# Patient Record
Sex: Female | Born: 2000 | Race: Black or African American | Hispanic: No | Marital: Single | Smoking: Never smoker
Health system: Southern US, Community
[De-identification: ages and names within clinical notes are randomized; demographics above are authoritative.]

## PROBLEM LIST (undated history)

## (undated) ENCOUNTER — Inpatient Hospital Stay (HOSPITAL_COMMUNITY): Payer: Self-pay

## (undated) DIAGNOSIS — I10 Essential (primary) hypertension: Secondary | ICD-10-CM

## (undated) DIAGNOSIS — F419 Anxiety disorder, unspecified: Secondary | ICD-10-CM

## (undated) DIAGNOSIS — B009 Herpesviral infection, unspecified: Secondary | ICD-10-CM

## (undated) DIAGNOSIS — Z91018 Allergy to other foods: Secondary | ICD-10-CM

## (undated) DIAGNOSIS — R112 Nausea with vomiting, unspecified: Secondary | ICD-10-CM

## (undated) DIAGNOSIS — O039 Complete or unspecified spontaneous abortion without complication: Secondary | ICD-10-CM

## (undated) DIAGNOSIS — J45909 Unspecified asthma, uncomplicated: Secondary | ICD-10-CM

## (undated) DIAGNOSIS — Z9889 Other specified postprocedural states: Secondary | ICD-10-CM

## (undated) DIAGNOSIS — F32A Depression, unspecified: Secondary | ICD-10-CM

## (undated) DIAGNOSIS — O139 Gestational [pregnancy-induced] hypertension without significant proteinuria, unspecified trimester: Secondary | ICD-10-CM

## (undated) HISTORY — DX: Allergy to other foods: Z91.018

## (undated) HISTORY — DX: Other specified postprocedural states: Z98.890

## (undated) HISTORY — DX: Other specified postprocedural states: R11.2

## (undated) HISTORY — DX: Essential (primary) hypertension: I10

## (undated) HISTORY — PX: NO PAST SURGERIES: SHX2092

## (undated) HISTORY — DX: Nausea with vomiting, unspecified: R11.2

---
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
## 2019-02-07 ENCOUNTER — Encounter (HOSPITAL_COMMUNITY): Payer: Self-pay

## 2019-02-07 ENCOUNTER — Inpatient Hospital Stay (HOSPITAL_COMMUNITY)
Admission: AD | Admit: 2019-02-07 | Discharge: 2019-02-07 | Disposition: A | Discharge status: Home / Self Care | Payer: Medicaid Other | Attending: Obstetrics and Gynecology | Admitting: Obstetrics and Gynecology

## 2019-02-07 ENCOUNTER — Ambulatory Visit (HOSPITAL_COMMUNITY)
Admission: EM | Admit: 2019-02-07 | Discharge: 2019-02-07 | Disposition: A | Discharge status: Home / Self Care | Payer: Medicaid Other | Attending: Family Medicine | Admitting: Family Medicine

## 2019-02-07 ENCOUNTER — Other Ambulatory Visit: Payer: Self-pay

## 2019-02-07 DIAGNOSIS — Z3202 Encounter for pregnancy test, result negative: Secondary | ICD-10-CM | POA: Diagnosis present

## 2019-02-07 DIAGNOSIS — R609 Edema, unspecified: Secondary | ICD-10-CM

## 2019-02-07 DIAGNOSIS — R109 Unspecified abdominal pain: Secondary | ICD-10-CM | POA: Diagnosis not present

## 2019-02-07 DIAGNOSIS — N912 Amenorrhea, unspecified: Secondary | ICD-10-CM | POA: Diagnosis not present

## 2019-02-07 DIAGNOSIS — Z5321 Procedure and treatment not carried out due to patient leaving prior to being seen by health care provider: Secondary | ICD-10-CM | POA: Insufficient documentation

## 2019-02-07 HISTORY — DX: Complete or unspecified spontaneous abortion without complication: O03.9

## 2019-02-07 LAB — POCT PREGNANCY, URINE
Preg Test, Ur: NEGATIVE
Preg Test, Ur: NEGATIVE

## 2019-02-07 NOTE — MAU Note (Signed)
Pt not in lobby.  

## 2019-02-07 NOTE — MAU Note (Signed)
PT left AMA 

## 2019-02-07 NOTE — Discharge Instructions (Addendum)
Your pregnancy test was negative.  Not having a menstrual cycle could be caused by many different things that we discussed I would follow up with your OB/GYN We are checking you for STDs today, bacteria and yeast infection.   Make sure that you are elevating your feet and getting up and moving around.  This could be the cause of your swelling.  You can take the HCTZ that you have.  Try using some compression stockings this may help.  Follow up as needed for continued or worsening symptoms

## 2019-02-07 NOTE — MAU Note (Signed)
PT brought 3 home pregnancy tests w/her. Says they were positive. LMP August 21. Has only mild cramping. Most concerned about bilateral swelling in feet and ankles.

## 2019-02-07 NOTE — ED Triage Notes (Signed)
Pt presents to UC stating she was supposed to start her period between 9/21 to 10/1. Pt reports feeling nauseas, ankle swelling, and headaches. Pt reports having a miscarriage in July 2020.

## 2019-02-07 NOTE — ED Provider Notes (Signed)
[facility]    CSN: [id number] Arrival date & time: 02/07/19  1309      History   Chief Complaint No chief complaint on file.   HPI Tracy Crawford is a 18 y.o. female.   Patient is a 18 year old female past medical history of anxiety, asthma, HSV, miscarriage, SI, major depressive disorder.  She presents today for possible pregnancy.  Reporting positive pregnancy test at home.  Patient reporting miscarriage back in July.  She has not had a menstrual cycle since August 25.  She has had some mild abdominal cramping that has been intermittent along with nausea, breast tenderness.  She is also had some bilateral feet and ankle swelling.  Was previously on hydrochlorothiazide for blood pressure control.  Has not been taking this medication.  Denies any calf pain, tenderness, chest pain or shortness of breath.  Admitting to a sedentary lifestyle.   ROS per HPI      Past Medical History:  Diagnosis Date  . Anxiety   . Asthma    non compliant with medication   . Food allergy    SHELLFISH, TREE NUTS  . HSV (herpes simplex virus) infection   . Miscarriage     Patient Active Problem List   Diagnosis Date Noted  . Vaginal delivery 02/26/2018  . Suicide attempt (HCC) 01/19/2018  . Herpes genitalis in women 01/19/2018  . MDD (major depressive disorder), severe (HCC) 05/11/2017    Past Surgical History:  Procedure Laterality Date  . NO PAST SURGERIES      OB History    Gravida  1   Para      Term      Preterm      AB      Living        SAB      TAB      Ectopic      Multiple      Live Births               Home Medications    Prior to Admission medications   Medication Sig Start Date End Date Taking? Authorizing Provider  albuterol (VENTOLIN HFA) 108 (90 Base) MCG/ACT inhaler Inhale 2 puffs into the lungs every 4 (four) hours as needed. 09/11/18   Bobbitt, Heywood Iles, MD  budesonide-formoterol [facility]) 160-4.5 MCG/ACT inhaler Inhale 2  puffs into the lungs 2 (two) times daily. 01/24/18   Bobbitt, Heywood Iles, MD  hydrochlorothiazide (HYDRODIURIL) 25 MG tablet Take 1 tablet (25 mg total) by mouth daily. 02/28/18   Nigel Bridgeman, CNM  montelukast (SINGULAIR) 10 MG tablet Take 0.5 tablets (5 mg total) by mouth at bedtime. 01/24/18   Bobbitt, Heywood Iles, MD  ferrous sulfate 325 (65 FE) MG tablet Take 1 tablet (325 mg total) by mouth daily with breakfast. 02/28/18 02/07/19  Nigel Bridgeman, CNM    Family History Family History  Problem Relation Age of Onset  . Asthma Father   . Asthma Maternal Aunt   . Asthma Paternal Grandmother     Social History Social History   Tobacco Use  . Smoking status: Never Smoker  . Smokeless tobacco: Never Used  Substance Use Topics  . Alcohol use: No  . Drug use: Yes    Frequency: 1.0 times per week    Types: Marijuana    Comment: Last marijuana use - August 2019     Allergies   Shellfish allergy, Apple, Augmentin [amoxicillin-pot clavulanate], Banana, Ceftin [cefuroxime axetil], Cherry, Peach [prunus persica], Plum  pulp, and Watermelon [citrullus vulgaris]   Review of Systems Review of Systems   Physical Exam Triage Vital Signs ED Triage Vitals  Enc Vitals Group     BP 02/07/19 1417 136/78     Pulse Rate 02/07/19 1417 91     Resp 02/07/19 1417 16     Temp 02/07/19 1417 98.5 F (36.9 C)     Temp Source 02/07/19 1417 Oral     SpO2 02/07/19 1417 100 %     Weight --      Height --      Head Circumference --      Peak Flow --      Pain Score 02/07/19 1421 1     Pain Loc --      Pain Edu? --      Excl. in [place]? --    No data found.  Updated Vital Signs BP 136/78 (BP Location: Right Arm)   Pulse 91   Temp 98.5 F (36.9 C) (Oral)   Resp 16   LMP 01/22/2019   SpO2 100%   Visual Acuity Right Eye Distance:   Left Eye Distance:   Bilateral Distance:    Right Eye Near:   Left Eye Near:    Bilateral Near:     Physical Exam Vitals signs and nursing note reviewed.   Constitutional:      General: She is not in acute distress.    Appearance: Normal appearance. She is not ill-appearing, toxic-appearing or diaphoretic.  HENT:     Head: Normocephalic.     Nose: Nose normal.     Mouth/Throat:     Pharynx: Oropharynx is clear.  Eyes:     Conjunctiva/sclera: Conjunctivae normal.  Neck:     Musculoskeletal: Normal range of motion.  Pulmonary:     Effort: Pulmonary effort is normal.  Abdominal:     Palpations: Abdomen is soft.     Tenderness: There is no abdominal tenderness.  Musculoskeletal: Normal range of motion.     Right lower leg: Edema present.     Left lower leg: Edema present.  Skin:    General: Skin is warm and dry.     Findings: No rash.  Neurological:     Mental Status: She is alert.  Psychiatric:        Mood and Affect: Mood normal.      UC Treatments / Results  Labs (all labs ordered are listed, but only abnormal results are displayed) Labs Reviewed  POC URINE PREG, ED  POCT PREGNANCY, URINE  CERVICOVAGINAL ANCILLARY ONLY    EKG   Radiology No results found.  Procedures Procedures (including critical care time)  Medications Ordered in UC Medications - No data to display  Initial Impression / Assessment and Plan / UC Course  I have reviewed the triage vital signs and the nursing notes.  Pertinent labs & imaging results that were available during my care of the patient were reviewed by me and considered in my medical decision making (see chart for details).     Negative pregnancy test-2 positives at home.  Patient reporting that she took the test and then did not check on the test for up to 30 minutes. Pregnancy test negative here. Most likely would be positive if she has not had a menstrual cycle since August 25. Amenorrhea must be related to something else.  Possible hormonal imbalance, PCOS.  Commended follow-up with OB/GYN for further evaluation and management.  Bilateral foot and ankle swelling.  Likely  from sedentary lifestyle. Blood pressure 136/78 today.  No concern for DVT Patient asked if she can take her hydrochlorothiazide she has at home.  I feel this would be appropriate. Also recommended exercise, elevating the feet more using compression stockings. Decrease salt intake  Final Clinical Impressions(s) / UC Diagnoses   Final diagnoses:  Encounter for pregnancy test, result negative  Amenorrhea     Discharge Instructions     Your pregnancy test was negative.  Not having a menstrual cycle could be caused by many different things that we discussed I would follow up with your OB/GYN We are checking you for STDs today, bacteria and yeast infection.   Make sure that you are elevating your feet and getting up and moving around.  This could be the cause of your swelling.  You can take the HCTZ that you have.  Try using some compression stockings this may help.  Follow up as needed for continued or worsening symptoms     ED Prescriptions    None     PDMP not reviewed this encounter.   Dahlia ByesBast, Haskell Rihn A, NP 02/07/19 1500

## 2019-02-07 NOTE — MAU Note (Signed)
PT was note in lobby when phlebotomy came to get her.

## 2019-02-12 ENCOUNTER — Telehealth (HOSPITAL_COMMUNITY): Payer: Self-pay | Admitting: Emergency Medicine

## 2019-02-12 ENCOUNTER — Other Ambulatory Visit: Payer: Medicaid Other

## 2019-02-12 LAB — CERVICOVAGINAL ANCILLARY ONLY
Bacterial vaginitis: POSITIVE — AB
Candida vaginitis: NEGATIVE
Chlamydia: POSITIVE — AB
Neisseria Gonorrhea: NEGATIVE
Trichomonas: NEGATIVE

## 2019-02-12 MED ORDER — METRONIDAZOLE 500 MG PO TABS: 500.0000 mg | ORAL_TABLET | Freq: Two times a day (BID) | ORAL | 0 refills | Status: AC

## 2019-02-12 MED ORDER — AZITHROMYCIN 250 MG PO TABS: 1000.0000 mg | ORAL_TABLET | Freq: Once | ORAL | 0 refills | Status: AC

## 2019-02-12 NOTE — Telephone Encounter (Signed)
Chlamydia is positive.  Rx po zithromax 1g #1 dose no refills was sent to the pharmacy of record.  Pt needs education to please refrain from sexual intercourse for 7 days to give the medicine time to work, sexual partners need to be notified and tested/treated.  Condoms may reduce risk of reinfection.  Recheck or followup with PCP for further evaluation if symptoms are not improving.   [facility] notified.  Bacterial vaginosis is positive. This was not treated at the urgent care visit.  Flagyl 500 mg BID x 7 days #14 no refills sent to patients pharmacy of choice.    Patient contacted and made aware of    results, all questions answered

## 2019-03-05 ENCOUNTER — Other Ambulatory Visit: Payer: Self-pay

## 2019-03-05 ENCOUNTER — Other Ambulatory Visit: Payer: Self-pay | Admitting: Allergy and Immunology

## 2019-03-05 MED ORDER — MONTELUKAST SODIUM 10 MG PO TABS: 5.0000 mg | ORAL_TABLET | Freq: Every day | ORAL | 0 refills | Status: DC

## 2019-03-05 MED ORDER — BUDESONIDE-FORMOTEROL FUMARATE 160-4.5 MCG/ACT IN AERO: 2.0000 | INHALATION_SPRAY | Freq: Two times a day (BID) | RESPIRATORY_TRACT | 5 refills | Status: DC

## 2019-03-05 NOTE — Telephone Encounter (Signed)
Spoke with patient. Patient informed that one time refills for Symbicort and Montelukast were sent to her pharmacy. Patient notified that no further refills would be authorized until after her 03/16/19 office visit. Patient verbalized understanding.

## 2019-03-05 NOTE — Telephone Encounter (Signed)
Patient is requesting refills on all medications from Dr. Verlin Fester. Last seen September of 2019. I advised she needed an appointment. Made one for 03-16-19. [facility] on [address].

## 2019-03-16 ENCOUNTER — Encounter: Payer: Self-pay | Admitting: Family Medicine

## 2019-03-16 ENCOUNTER — Ambulatory Visit (INDEPENDENT_AMBULATORY_CARE_PROVIDER_SITE_OTHER): Payer: Medicaid Other | Admitting: Family Medicine

## 2019-03-16 ENCOUNTER — Other Ambulatory Visit: Payer: Self-pay

## 2019-03-16 VITALS — BP 132/86 | HR 90 | Temp 98.2°F | Resp 18

## 2019-03-16 DIAGNOSIS — J4540 Moderate persistent asthma, uncomplicated: Secondary | ICD-10-CM

## 2019-03-16 DIAGNOSIS — J3089 Other allergic rhinitis: Secondary | ICD-10-CM

## 2019-03-16 DIAGNOSIS — J302 Other seasonal allergic rhinitis: Secondary | ICD-10-CM

## 2019-03-16 DIAGNOSIS — T7800XD Anaphylactic reaction due to unspecified food, subsequent encounter: Secondary | ICD-10-CM | POA: Diagnosis not present

## 2019-03-16 DIAGNOSIS — H1013 Acute atopic conjunctivitis, bilateral: Secondary | ICD-10-CM

## 2019-03-16 MED ORDER — ALBUTEROL SULFATE HFA 108 (90 BASE) MCG/ACT IN AERS: 2.0000 | INHALATION_SPRAY | RESPIRATORY_TRACT | 5 refills | Status: DC | PRN

## 2019-03-16 MED ORDER — MONTELUKAST SODIUM 10 MG PO TABS: 10.0000 mg | ORAL_TABLET | Freq: Every day | ORAL | 5 refills | Status: DC

## 2019-03-16 MED ORDER — FLUTICASONE PROPIONATE 50 MCG/ACT NA SUSP: 1.0000 | Freq: Every day | NASAL | 5 refills | Status: DC

## 2019-03-16 MED ORDER — PAZEO 0.7 % OP SOLN: 1.0000 [drp] | Freq: Once | OPHTHALMIC | 5 refills | Status: AC

## 2019-03-16 MED ORDER — CETIRIZINE HCL 10 MG PO TABS: 10.0000 mg | ORAL_TABLET | Freq: Every day | ORAL | 5 refills | Status: DC

## 2019-03-16 MED ORDER — EPINEPHRINE 0.3 MG/0.3ML IJ SOAJ: 0.3000 mg | Freq: Once | INTRAMUSCULAR | 2 refills | Status: AC

## 2019-03-16 MED ORDER — BUDESONIDE-FORMOTEROL FUMARATE 160-4.5 MCG/ACT IN AERO: 2.0000 | INHALATION_SPRAY | Freq: Two times a day (BID) | RESPIRATORY_TRACT | 5 refills | Status: DC

## 2019-03-16 NOTE — Progress Notes (Signed)
[address] [place] [address] Dept: [phone]  FOLLOW UP NOTE  Patient ID: Tracy Crawford, female    DOB: August 02, 2000  Age: 18 y.o. MRN: [id number] Date of Office Visit: 03/16/2019  Assessment  Chief Complaint: Wheezing  HPI Tracy Crawford is a 18 year old female who presents to the clinic for a follow up visit. She was last seen in this clinic on 01/24/2018 by Dr. Nunzio Cobbs for evaluation of asthma, allergic rhinitis, allergic conjunctivitis, and food allergy to shellfish, tree nuts, and fruits. At today's visit, she reports her asthma has not been well controlled for about 1-2 weeks with wheezing and shortness of breath with activity. She reports she is taking montelukast 10 mg once a day and using Symbicort 160-2 puffs every 6 hours without a spacer. She has been out of albuterol for a few months. Records indicate that she has not picked up these medications from the pharmacy listed on a regular basis. Allergic rhinitis is reported as not well controlled with nasal congestion and thick post nasal drainage for which she is not currently using any medical intervention. She reports allergic rhinitis has not been well controlled with frequent red, itchy eyes and she is out of antihistamine eye drops at this time. Her current medications are listed in the chart.    Drug Allergies:  Allergies  Allergen Reactions  . Shellfish Allergy Anaphylaxis  . Apple Itching  . Augmentin [Amoxicillin-Pot Clavulanate] Hives    Has patient had a PCN reaction causing immediate rash, facial/tongue/throat swelling, SOB or lightheadedness with hypotension: Unknown Has patient had a PCN reaction causing severe rash involving mucus membranes or skin necrosis: Unknown Has patient had a PCN reaction that required hospitalization: Unknown Has patient had a PCN reaction occurring within the last 10 years: Yes If all of the above answers are "NO", then may proceed with Cephalosporin use.   . Banana Itching   . Ceftin [Cefuroxime Axetil] Hives    Unknown reaction  . Cherry   . Peach [Prunus Persica] Itching  . Plum Pulp Itching  . Watermelon [Citrullus Vulgaris] Itching    Physical Exam: BP 132/86 (BP Location: Right Arm, Patient Position: Sitting, Cuff Size: Normal)   Pulse 90   Temp 98.2 F (36.8 C) (Temporal)   Resp 18   SpO2 97%    Physical Exam Vitals signs reviewed.  Constitutional:      Appearance: Normal appearance.  HENT:     Head: Normocephalic and atraumatic.     Right Ear: Tympanic membrane normal.     Left Ear: Tympanic membrane normal.     Nose:     Comments: Bilateral nares edematous and pale with clear nasal drainage noted. Pharynx slightly erythematous with no exudate noted. Ears normal. Eyes normal. Eyes:     Conjunctiva/sclera: Conjunctivae normal.  Neck:     Musculoskeletal: Normal range of motion and neck supple.  Cardiovascular:     Rate and Rhythm: Normal rate and regular rhythm.     Heart sounds: Normal heart sounds. No murmur.  Pulmonary:     Effort: Pulmonary effort is normal.     Breath sounds: Normal breath sounds.     Comments: Lungs clear to auscultation Musculoskeletal: Normal range of motion.  Skin:    General: Skin is warm and dry.  Neurological:     Mental Status: She is alert and oriented to person, place, and time.  Psychiatric:        Mood and Affect: Mood normal.  Behavior: Behavior normal.        Thought Content: Thought content normal.        Judgment: Judgment normal.     Diagnostics: FVC 3.86, FEV1 3.17. Predicted FVC 3.51, predicted FEV1 3.13. Spirometry indicates normal ventilatory function.   Assessment and Plan: 1. Moderate persistent asthma without complication   2. Seasonal and perennial allergic rhinitis   3. Allergic conjunctivitis of both eyes   4. Anaphylactic shock due to food, subsequent encounter     Meds ordered this encounter  Medications  . montelukast (SINGULAIR) 10 MG tablet    Sig: Take 1  tablet (10 mg total) by mouth at bedtime.    Dispense:  30 tablet    Refill:  5  . budesonide-formoterol (SYMBICORT) 160-4.5 MCG/ACT inhaler    Sig: Inhale 2 puffs into the lungs 2 (two) times daily. Use with spacer.    Dispense:  1 Inhaler    Refill:  5  . albuterol (VENTOLIN HFA) 108 (90 Base) MCG/ACT inhaler    Sig: Inhale 2 puffs into the lungs every 4 (four) hours as needed for wheezing or shortness of breath.    Dispense:  18 g    Refill:  5  . cetirizine (ZYRTEC) 10 MG tablet    Sig: Take 1 tablet (10 mg total) by mouth daily. Use as needed.    Dispense:  30 tablet    Refill:  5  . fluticasone (FLONASE) 50 MCG/ACT nasal spray    Sig: Place 1-2 sprays into both nostrils daily. As needed.    Dispense:  1 g    Refill:  5  . Olopatadine HCl (PAZEO) 0.7 % SOLN    Sig: Place 1 drop into both eyes once for 1 dose. As needed.    Dispense:  2.5 mL    Refill:  5  . EPINEPHrine (EPIPEN 2-PAK) 0.3 mg/0.3 mL IJ SOAJ injection    Sig: Inject 0.3 mLs (0.3 mg total) into the muscle once for 1 dose.    Dispense:  2 each    Refill:  2    Patient Instructions  Asthma Continue montelukast 10 mg once a day to prevent cough or wheeze Continue Symbicort 160-2 puffs twice a day with a spacer to prevent cough or wheeze Continue albuterol 2 puffs every 4 hours as needed for a cough or wheeze  Allergic rhinitis Begin cetirizine 10 mg once a day as needed for a runny nose Begin Flonase 1-2 sprays in each nostril once a day as needed for a stuffy nose Consider saline nasal rinses as needed for nasal symptoms. Use this before any medicated nasal sprays for best result  Allergic conjunctivitis Begin Pazeo one drop in each eye once a day as needed for red, itchy eyes  Food allergy Continue to avoid shellfish, tree nuts, and fruits that bother your mouth In case of an allergic reaction, take Benadryl 50 mg every 4 hours, and if life-threatening symptoms occur, inject with EpiPen 0.3 mg.  Call  the clinic if this treatment plan is not working well for you  Follow up in 2 months or sooner if needed.   Return in about 2 months (around 05/16/2019), or if symptoms worsen or fail to improve.    Thank you for the opportunity to care for this patient.  Please do not hesitate to contact me with questions.  Gareth Morgan, FNP [facility]

## 2019-03-16 NOTE — Patient Instructions (Addendum)
Asthma Continue montelukast 10 mg once a day to prevent cough or wheeze Continue Symbicort 160-2 puffs twice a day with a spacer to prevent cough or wheeze Continue albuterol 2 puffs every 4 hours as needed for a cough or wheeze  Allergic rhinitis Begin cetirizine 10 mg once a day as needed for a runny nose Begin Flonase 1-2 sprays in each nostril once a day as needed for a stuffy nose Consider saline nasal rinses as needed for nasal symptoms. Use this before any medicated nasal sprays for best result  Allergic conjunctivitis Begin Pazeo one drop in each eye once a day as needed for red, itchy eyes  Food allergy Continue to avoid shellfish, tree nuts, and fruits that bother your mouth In case of an allergic reaction, take Benadryl 50 mg every 4 hours, and if life-threatening symptoms occur, inject with EpiPen 0.3 mg.  Call the clinic if this treatment plan is not working well for you  Follow up in 2 months or sooner if needed.

## 2019-04-05 ENCOUNTER — Other Ambulatory Visit: Payer: Self-pay | Admitting: Nurse Practitioner

## 2019-04-09 ENCOUNTER — Other Ambulatory Visit: Payer: Medicaid Other

## 2019-05-17 NOTE — Progress Notes (Deleted)
   [address] [place] [address] Dept: [phone]  FOLLOW UP NOTE  Patient ID: Tracy Crawford, female    DOB: October 05, 2000  Age: 19 y.o. MRN: [id number] Date of Office Visit: 05/18/2019  Assessment  Chief Complaint: No chief complaint on file.  HPI Tracy Crawford is a 19 year old female who [facility] for a follow up visit. She was last seen in this clinic on 03/16/2019 for evaluation of asthma, allergic rhinoconjunctivitis, and food allergy to tree nuts, shellfish, and fruits.    Drug Allergies:  Allergies  Allergen Reactions  . Shellfish Allergy Anaphylaxis  . Apple Itching  . Augmentin [Amoxicillin-Pot Clavulanate] Hives    Has patient had a PCN reaction causing immediate rash, facial/tongue/throat swelling, SOB or lightheadedness with hypotension: Unknown Has patient had a PCN reaction causing severe rash involving mucus membranes or skin necrosis: Unknown Has patient had a PCN reaction that required hospitalization: Unknown Has patient had a PCN reaction occurring within the last 10 years: Yes If all of the above answers are "NO", then may proceed with Cephalosporin use.   . Banana Itching  . Ceftin [Cefuroxime Axetil] Hives    Unknown reaction  . Cherry   . Peach [Prunus Persica] Itching  . Plum Pulp Itching  . Watermelon [Citrullus Vulgaris] Itching    Physical Exam: There were no vitals taken for this visit.   Physical Exam  Diagnostics:    Assessment and Plan: No diagnosis found.  No orders of the defined types were placed in this encounter.   There are no Patient Instructions on file for this visit.  No follow-ups on file.    Thank you for the opportunity to care for this patient.  Please do not hesitate to contact me with questions.  Thermon Leyland, FNP [facility]

## 2019-05-18 ENCOUNTER — Ambulatory Visit: Payer: Medicaid Other | Admitting: Family Medicine

## 2019-06-23 ENCOUNTER — Other Ambulatory Visit: Payer: Self-pay

## 2019-06-23 ENCOUNTER — Inpatient Hospital Stay (HOSPITAL_COMMUNITY)
Admission: AD | Admit: 2019-06-23 | Discharge: 2019-06-23 | Disposition: A | Discharge status: Home / Self Care | Payer: Medicaid Other | Attending: Obstetrics & Gynecology | Admitting: Obstetrics & Gynecology

## 2019-06-23 DIAGNOSIS — Z3202 Encounter for pregnancy test, result negative: Secondary | ICD-10-CM | POA: Insufficient documentation

## 2019-06-23 LAB — POCT PREGNANCY, URINE: Preg Test, Ur: NEGATIVE

## 2019-06-23 LAB — HCG, SERUM, QUALITATIVE: Preg, Serum: NEGATIVE

## 2019-06-23 NOTE — MAU Provider Note (Signed)
S:   19 y.o. G1P0 @Unknown  by LMP presents to MAU for pregnancy confirmation.  She denies abdominal pain or vaginal bleeding today.  She had a positive pregnancy test at home and states she knows that our pregnancy test here will be negative because "the dollar store tests are always negative".  Has had abnormal periods since miscarriage. Does not have a PCP or a GYN.   O: BP (!) 145/78 (BP Location: Right Arm)   Pulse 65   Temp 98.2 F (36.8 C) (Oral)   Resp 18   LMP 04/03/2019   SpO2 99%  Physical Examination: General appearance - alert, well appearing, and in no distress, oriented to person, place, and time and acyanotic, in no respiratory distress  Results for orders placed or performed during the hospital encounter of 06/23/19 (from the past 48 hour(s))  Pregnancy, urine POC     Status: None   Collection Time: 06/23/19 12:04 PM  Result Value Ref Range   Preg Test, Ur NEGATIVE NEGATIVE    Comment:        THE SENSITIVITY OF THIS METHODOLOGY IS >24 mIU/mL   hCG, serum, qualitative     Status: None   Collection Time: 06/23/19 12:26 PM  Result Value Ref Range   Preg, Serum NEGATIVE NEGATIVE    Comment:        THE SENSITIVITY OF THIS METHODOLOGY IS >10 mIU/mL. Performed at [facility], [address], [place], [place]     A: Missed menses Negative urine pregnancy test Negative Qualitative Hcg   P: D/C home Discussed importance of establishing care with PCP and GYN. Return to MAU as needed for pregnancy related emergencies  [id number], NP 1:50 PM

## 2019-06-23 NOTE — MAU Note (Signed)
Pt reports to mau with c/o vag spotting and cramping after missing her period.  Pt reports she got a pos preg test on 06/15/19.  Pt reports intercourse yesterday.  Pt reports she has been taking pregnancy tests weekly since December.

## 2019-07-26 ENCOUNTER — Encounter: Payer: Self-pay | Admitting: *Deleted

## 2019-08-08 ENCOUNTER — Telehealth: Payer: Medicaid Other | Admitting: Family Medicine

## 2019-08-08 ENCOUNTER — Other Ambulatory Visit: Payer: Self-pay

## 2019-08-24 ENCOUNTER — Other Ambulatory Visit: Payer: Self-pay

## 2019-08-24 ENCOUNTER — Encounter: Payer: Self-pay | Admitting: Family Medicine

## 2019-08-24 ENCOUNTER — Ambulatory Visit (INDEPENDENT_AMBULATORY_CARE_PROVIDER_SITE_OTHER): Payer: Medicaid Other | Admitting: Family Medicine

## 2019-08-24 VITALS — BP 125/83 | HR 101 | Ht 62.5 in | Wt 272.0 lb

## 2019-08-24 DIAGNOSIS — E282 Polycystic ovarian syndrome: Secondary | ICD-10-CM | POA: Diagnosis present

## 2019-08-24 MED ORDER — NORGESTIMATE-ETH ESTRADIOL 0.25-35 MG-MCG PO TABS: 1.0000 | ORAL_TABLET | Freq: Every day | ORAL | 3 refills | Status: DC

## 2019-08-24 NOTE — Patient Instructions (Addendum)
Talk to your PCP about a referral to: Dr Debbra Riding [facility] Phone Number: [phone]  Diet for Polycystic Ovary Syndrome Polycystic ovary syndrome (PCOS) is a disorder of the chemicals (hormones) that regulate a woman's reproductive system, including monthly periods (menstruation). The condition causes important hormones to be out of balance. PCOS can:  Stop your periods or make them irregular.  Cause cysts to develop on your ovaries.  Make it difficult to get pregnant.  Stop your body from responding to the effects of insulin (insulin resistance). Insulin resistance can lead to obesity and diabetes. Changing what you eat can help you manage PCOS and improve your health. Following a balanced diet can help you lose weight and improve the way that your body uses insulin. What are tips for following this plan?  Follow a balanced diet for meals and snacks. Eat breakfast, lunch, dinner, and one or two snacks every day.  Include protein in each meal and snack.  Choose whole grains instead of products that are made with refined flour.  Eat a variety of foods.  Exercise regularly as told by your health care provider. Aim to do 30 or more minutes of exercise on most days of the week.  If you are overweight or obese: ? Pay attention to how many calories you eat. Cutting down on calories can help you lose weight. ? Work with your health care provider or a diet and nutrition specialist (dietitian) to figure out how many calories you need each day. What foods can I eat?  Fruits Include a variety of colors and types. All fruits are helpful for PCOS. Vegetables Include a variety of colors and types. All vegetables are helpful for PCOS. Grains Whole grains, such as whole wheat. Whole-grain breads, crackers, cereals, and pasta. Unsweetened oatmeal, bulgur, barley, quinoa, and brown rice. Tortillas made from corn or whole-wheat flour. Meats and other proteins  Low-fat (lean) proteins, such as fish, chicken, beans, eggs, and tofu. Dairy Low-fat dairy products, such as skim milk, cheese sticks, and yogurt. Beverages Low-fat or fat-free drinks, such as water, low-fat milk, sugar-free drinks, and small amounts of 100% fruit juice. Seasonings and condiments Ketchup. Mustard. Barbecue sauce. Relish. Low-fat or fat-free mayonnaise. Fats and oils Olive oil or canola oil. Walnuts and almonds. The items listed above may not be a complete list of recommended foods and beverages. Contact a dietitian for more options. What foods are not recommended? Foods that are high in calories or fat. Fried foods. Sweets. Products that are made from refined white flour, including white bread, pastries, white rice, and pasta. The items listed above may not be a complete list of foods and beverages to avoid. Contact a dietitian for more information. Summary  PCOS is a hormonal imbalance that affects a woman's reproductive system.  You can help to manage your PCOS by exercising regularly and eating a healthy, varied diet of vegetables, fruit, whole grains, low-fat (lean) protein, and low-fat dairy products.  Changing what you eat can improve the way that your body uses insulin, help your hormones reach normal levels, and help you lose weight. This information is not intended to replace advice given to you by your health care provider. Make sure you discuss any questions you have with your health care provider. Document Revised: 08/09/2018 Document Reviewed: 02/21/2017 Elsevier Patient Education  2020 [employer].

## 2019-08-24 NOTE — Progress Notes (Signed)
   Subjective:    Patient ID: Tracy Crawford, female    DOB: 03-Oct-2000, 19 y.o.   MRN: [id number]  HPI 19 yo G2P1011. Menses started at age 53 or 44. Initially irregular - skipped 2-3 months, which lasted like this for 2-3 years and became regular at age 28. Pregnancy at age 33 - NSVD (had preeclampsia). Periods resumed and were regular. Pregnancy June or July 2020, had miscarriage. Resumed regular periods shortly after and continued to have regular until December 2020, then stopped. Denies hirsutism.   Weight increased over the past year. Has been losing weight since last appointment - about 15# in 1 month.  No Gyn surgical history.  Mom had irregular periods (possibly only as a teenager). Possibly has PCOS.  Denies migraines, fam or personal history of DVE.   I have reviewed the patients past medical, family, and social history.  I have reviewed the patient's medication list and allergies.   Review of Systems      Objective:   Physical Exam Vitals reviewed.  Constitutional:      Appearance: Normal appearance.  HENT:     Head: Normocephalic and atraumatic.  Neck:     Vascular: No carotid bruit.  Cardiovascular:     Rate and Rhythm: Normal rate and regular rhythm.     Pulses: Normal pulses.  Pulmonary:     Effort: Pulmonary effort is normal.     Breath sounds: Normal breath sounds.  Abdominal:     General: Abdomen is flat.     Palpations: Abdomen is soft.  Musculoskeletal:     Cervical back: Normal range of motion and neck supple. No rigidity.  Lymphadenopathy:     Cervical: No cervical adenopathy.  Skin:    General: Skin is warm and dry.     Capillary Refill: Capillary refill takes less than 2 seconds.  Neurological:     General: No focal deficit present.     Mental Status: She is alert.  Psychiatric:        Mood and Affect: Mood normal.        Behavior: Behavior normal.        Thought Content: Thought content normal.        Judgment: Judgment normal.          Assessment & Plan:  1. PCOS (polycystic ovarian syndrome) Labs from [facility] center reviewed: TSH, testosterone levels normal. LH 13, FSH 5.8. 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 Discussed treatment options: weight loss key component of weight loss, which she is already doing by herself. Recommended continued weight loss. She should have a referral to [facility], who can continue to help her with non-surgical weight loss.  Will also start her on COCs, which will help with cycle control and endometrial protection. Can consider metformin in the future. Will follow up in 3-4 months for continued evaluation.  35 minutes was spent in taking history, reviewing labs with patient, and in formulating treatment plan with the patient.

## 2019-08-24 NOTE — Progress Notes (Signed)
States here because she hasn't had a period in 5-6 months . Have been seen at [facility] and had tests done and referred to [place]. Elevated GAD 7. Offered [facility], she declines. States she has a therapist if needed. Richelle Glick,RN

## 2019-08-25 ENCOUNTER — Emergency Department (HOSPITAL_COMMUNITY)
Admission: EM | Admit: 2019-08-25 | Discharge: 2019-08-25 | Disposition: A | Discharge status: Home / Self Care | Payer: Medicaid Other | Attending: Emergency Medicine | Admitting: Emergency Medicine

## 2019-08-25 ENCOUNTER — Emergency Department (HOSPITAL_COMMUNITY): Payer: Medicaid Other

## 2019-08-25 ENCOUNTER — Other Ambulatory Visit: Payer: Self-pay

## 2019-08-25 DIAGNOSIS — Z20822 Contact with and (suspected) exposure to covid-19: Secondary | ICD-10-CM | POA: Insufficient documentation

## 2019-08-25 DIAGNOSIS — J4521 Mild intermittent asthma with (acute) exacerbation: Secondary | ICD-10-CM | POA: Diagnosis not present

## 2019-08-25 DIAGNOSIS — I10 Essential (primary) hypertension: Secondary | ICD-10-CM | POA: Diagnosis not present

## 2019-08-25 DIAGNOSIS — Z79899 Other long term (current) drug therapy: Secondary | ICD-10-CM | POA: Insufficient documentation

## 2019-08-25 DIAGNOSIS — R0602 Shortness of breath: Secondary | ICD-10-CM | POA: Diagnosis present

## 2019-08-25 LAB — RESPIRATORY PANEL BY RT PCR (FLU A&B, COVID)
Influenza A by PCR: NEGATIVE
Influenza B by PCR: NEGATIVE
SARS Coronavirus 2 by RT PCR: NEGATIVE

## 2019-08-25 LAB — COMPREHENSIVE METABOLIC PANEL
ALT: 29 U/L (ref 0–44)
AST: 30 U/L (ref 15–41)
Albumin: 4.3 g/dL (ref 3.5–5.0)
Alkaline Phosphatase: 93 U/L (ref 38–126)
Anion gap: 9 (ref 5–15)
BUN: 8 mg/dL (ref 6–20)
CO2: 21 mmol/L — ABNORMAL LOW (ref 22–32)
Calcium: 9.3 mg/dL (ref 8.9–10.3)
Chloride: 107 mmol/L (ref 98–111)
Creatinine, Ser: 0.65 mg/dL (ref 0.44–1.00)
GFR calc Af Amer: >60 mL/min (ref >60)
GFR calc non Af Amer: >60 mL/min (ref >60)
Glucose, Bld: 133 mg/dL — ABNORMAL HIGH (ref 70–99)
Potassium: 3.9 mmol/L (ref 3.5–5.1)
Sodium: 137 mmol/L (ref 135–145)
Total Bilirubin: 0.4 mg/dL (ref 0.3–1.2)
Total Protein: 7.2 g/dL (ref 6.5–8.1)

## 2019-08-25 LAB — CBC WITH DIFFERENTIAL/PLATELET
Abs Immature Granulocytes: 0.09 10*3/uL — ABNORMAL HIGH (ref 0.00–0.07)
Basophils Absolute: 0 10*3/uL (ref 0.0–0.1)
Basophils Relative: 0 %
Eosinophils Absolute: 0 10*3/uL (ref 0.0–0.5)
Eosinophils Relative: 0 %
HCT: 42.2 % (ref 36.0–46.0)
Hemoglobin: 13 g/dL (ref 12.0–15.0)
Immature Granulocytes: 1 %
Lymphocytes Relative: 9 %
Lymphs Abs: 1 10*3/uL (ref 0.7–4.0)
MCH: 23 pg — ABNORMAL LOW (ref 26.0–34.0)
MCHC: 30.8 g/dL (ref 30.0–36.0)
MCV: 74.6 fL — ABNORMAL LOW (ref 80.0–100.0)
Monocytes Absolute: 0.2 10*3/uL (ref 0.1–1.0)
Monocytes Relative: 1 %
Neutro Abs: 10.2 10*3/uL — ABNORMAL HIGH (ref 1.7–7.7)
Neutrophils Relative %: 89 %
Platelets: 384 10*3/uL (ref 150–400)
RBC: 5.66 MIL/uL — ABNORMAL HIGH (ref 3.87–5.11)
RDW: 16.1 % — ABNORMAL HIGH (ref 11.5–15.5)
WBC: 11.5 10*3/uL — ABNORMAL HIGH (ref 4.0–10.5)
nRBC: 0 % (ref 0.0–0.2)

## 2019-08-25 LAB — MAGNESIUM: Magnesium: 2 mg/dL (ref 1.7–2.4)

## 2019-08-25 LAB — PREGNANCY, URINE: Preg Test, Ur: NEGATIVE

## 2019-08-25 LAB — D-DIMER, QUANTITATIVE: D-Dimer, Quant: 0.35 ug/mL-FEU (ref 0.00–0.50)

## 2019-08-25 LAB — TROPONIN I (HIGH SENSITIVITY): Troponin I (High Sensitivity): 3 ng/L (ref <18)

## 2019-08-25 MED ORDER — MAGNESIUM SULFATE 2 GM/50ML IV SOLN
2.0000 g | Freq: Once | INTRAVENOUS | Status: AC
  Administered 2019-08-25: 12:00:00 2 g via INTRAVENOUS
  Filled 2019-08-25: qty 50

## 2019-08-25 MED ORDER — ALBUTEROL SULFATE HFA 108 (90 BASE) MCG/ACT IN AERS
8.0000 | INHALATION_SPRAY | Freq: Once | RESPIRATORY_TRACT | Status: AC
  Administered 2019-08-25: 2 via RESPIRATORY_TRACT
  Filled 2019-08-25: qty 6.7

## 2019-08-25 NOTE — ED Notes (Signed)
Pt reports to be wheezing, provider [facility] notified and she suggest perform a breathing treatment at triage room

## 2019-08-25 NOTE — Discharge Instructions (Signed)
Please call your primary care doctor to make an appointment since you have had a asthma exacerbation.  Please use your Symbicort, typical medications including Zyrtec, montelukast and albuterol as prescribed.  You may need to increase your asthma treatments regimen. As we discussed today prednisone can cause increased weight.  Your wheezing and shortness of breath has dramatically improved while you are in the ED the sound very clear to me today so take it is reasonable to send home without any prednisone today--as we discussed.

## 2019-08-25 NOTE — ED Triage Notes (Addendum)
Pt c/o SOB after needing to use her inhaler and misplaced it. EMS gave 10 albuterol, atrovent, and 125mg  solumedrol. 20 ga in RAC. NSR on EMS EKG strip.

## 2019-08-25 NOTE — ED Provider Notes (Signed)
[facility] EMERGENCY DEPARTMENT Provider Note   CSN: [id number] Arrival date & time: 08/25/19  3220     History Chief Complaint  Patient presents with  . Pleurisy  . Shortness of Breath    Tracy Crawford is a 19 y.o. female.  HPI  Patient is 19 year old female with history of anxiety, asthma, hypertension  Patient states that she is on Symbicort daily, Zyrtec, Flonase, montelukast, and albuterol rescue inhaler.  She states that she takes her medications as prescribed and has no missed doses.  She states that she did lose her inhaler yesterday and states that she felt somewhat short of breath last night which is worsened she will very short of breath this morning and presented to the ED for concerns about pulmonary embolism.  Patient states that she has had chest pain for the past 2 weeks states that it is intermittent pleuritic, nonexertional, nonradiating sharp and right-sided.  She states that she also has some left calf pain.  Patient states that her sister had a pulmonary embolism after having similar symptoms.  Patient denies any fevers, chills, cough,.  She does endorse some congestion.  She states that she does not feel that seasonal allergies are trigger for her asthma however she does say that her asthma tends to get worse in the spring.  Patient states that she was wheezing terribly this morning and the EMS gave her albuterol treatment, Atrovent, Solu-Medrol.  She received an additional albuterol inhaler treatment in triage.  Patient states that she does occasionally have leg swelling and states that she had a cramp in her left leg this morning.  She is very concerned about VTE.  She denies any history of VTE, cancer, hemoptysis and denies any palpitations.  She like to have CT scan of her chest done to rule out PE but is agreeable to having D-dimer run instead.  Patient denies any recent surgery, immobilization, hemoptysis, does state that she is on an estrogen  containing OCP however.  Patient states that she has had to be hospitalized for asthma before.  She has never been intubated.  She states that she knows what her asthma feels like and states that she has never had chest pain of her asthma before.    Past Medical History:  Diagnosis Date  . Anxiety   . Asthma    non compliant with medication   . Food allergy    SHELLFISH, TREE NUTS  . HSV (herpes simplex virus) infection   . Hypertension    PIH  . Miscarriage     Patient Active Problem List   Diagnosis Date Noted  . Vaginal delivery 02/26/2018  . Suicide attempt ([place]) 01/19/2018  . Herpes genitalis in women 01/19/2018  . MDD (major depressive disorder), severe ([place]) 05/11/2017    Past Surgical History:  Procedure Laterality Date  . NO PAST SURGERIES       OB History    Gravida  2   Para  1   Term  1   Preterm      AB  1   Living  1     SAB  1   TAB      Ectopic      Multiple      Live Births  1           Family History  Problem Relation Age of Onset  . Asthma Father   . Asthma Maternal Aunt   . Asthma Paternal Grandmother   .  Allergic rhinitis Neg Hx   . Angioedema Neg Hx   . Immunodeficiency Neg Hx   . Eczema Neg Hx   . Urticaria Neg Hx     Social History   Tobacco Use  . Smoking status: Never Smoker  . Smokeless tobacco: Never Used  Substance Use Topics  . Alcohol use: No  . Drug use: Yes    Frequency: 1.0 times per week    Types: Marijuana    Comment: Last marijuana use - August 2019    Home Medications Prior to Admission medications   Medication Sig Start Date End Date Taking? Authorizing Provider  albuterol (VENTOLIN HFA) 108 (90 Base) MCG/ACT inhaler Inhale 2 puffs into the lungs every 4 (four) hours as needed. Patient not taking: Reported on 03/16/2019 09/11/18   Bobbitt, Heywood Iles, MD  albuterol (VENTOLIN HFA) 108 (90 Base) MCG/ACT inhaler Inhale 2 puffs into the lungs every 4 (four) hours as needed for wheezing or  shortness of breath. 03/16/19   Hetty Blend, FNP  budesonide-formoterol (SYMBICORT) 160-4.5 MCG/ACT inhaler Inhale 2 puffs into the lungs 2 (two) times daily. 03/05/19   Bobbitt, Heywood Iles, MD  budesonide-formoterol (SYMBICORT) 160-4.5 MCG/ACT inhaler Inhale 2 puffs into the lungs 2 (two) times daily. Use with spacer. 03/16/19   Hetty Blend, FNP  cetirizine (ZYRTEC) 10 MG tablet Take 1 tablet (10 mg total) by mouth daily. Use as needed. 03/16/19   Hetty Blend, FNP  EPINEPHrine 0.3 mg/0.3 mL IJ SOAJ injection SMARTSIG:0.3 Milliliter(s) IM Once 03/16/19   [provider]  fluticasone (FLONASE) 50 MCG/ACT nasal spray Place 1-2 sprays into both nostrils daily. As needed. 03/16/19   Hetty Blend, FNP  hydrochlorothiazide (HYDRODIURIL) 25 MG tablet Take 1 tablet (25 mg total) by mouth daily. Patient not taking: Reported on 03/16/2019 02/28/18   Nigel Bridgeman, CNM  montelukast (SINGULAIR) 10 MG tablet Take 0.5 tablets (5 mg total) by mouth at bedtime. 03/05/19   Bobbitt, Heywood Iles, MD  montelukast (SINGULAIR) 10 MG tablet Take 1 tablet (10 mg total) by mouth at bedtime. 03/16/19   Hetty Blend, FNP  norgestimate-ethinyl estradiol (ORTHO-CYCLEN) 0.25-35 MG-MCG tablet Take 1 tablet by mouth daily. 08/24/19   Levie Heritage, DO  ferrous sulfate 325 (65 FE) MG tablet Take 1 tablet (325 mg total) by mouth daily with breakfast. 02/28/18 02/07/19  Nigel Bridgeman, CNM    Allergies    Shellfish allergy, Apple, Augmentin [amoxicillin-pot clavulanate], Banana, Ceftin [cefuroxime axetil], Cherry, Peach [prunus persica], Plum pulp, and Watermelon [citrullus vulgaris]  Review of Systems   Review of Systems  Constitutional: Negative for chills and fever.  HENT: Negative for congestion.   Eyes: Negative for pain.  Respiratory: Positive for chest tightness and shortness of breath. Negative for cough.   Cardiovascular: Positive for chest pain. Negative for leg swelling.  Gastrointestinal: Negative for  abdominal pain, diarrhea, nausea and vomiting.  Genitourinary: Negative for dysuria, flank pain and vaginal bleeding.  Musculoskeletal: Positive for myalgias.       Left calf pain  Skin: Negative for rash.  Neurological: Negative for dizziness and headaches.    Physical Exam Updated Vital Signs BP 121/85 (BP Location: Left Arm)   Pulse 84   Temp 97.8 F (36.6 C) (Oral)   Resp 18   SpO2 97%   Physical Exam Vitals and nursing note reviewed.  Constitutional:      General: She is not in acute distress.    Appearance: She is obese.  Comments: Patient is pleasant, 19 year old female, morbidly obese.  Able answer questions appropriately follow commands.  Speaks in full sentences.  HENT:     Head: Normocephalic and atraumatic.     Nose: Nose normal.     Mouth/Throat:     Mouth: Mucous membranes are moist.  Eyes:     General: No scleral icterus. Cardiovascular:     Rate and Rhythm: Normal rate and regular rhythm.     Pulses: Normal pulses.     Heart sounds: Normal heart sounds.  Pulmonary:     Effort: Pulmonary effort is normal. No respiratory distress.     Breath sounds: Wheezing present.     Comments: Expiratory wheezing noted Abdominal:     Palpations: Abdomen is soft.     Tenderness: There is no abdominal tenderness. There is no guarding or rebound.     Comments: Obese abdomen  Musculoskeletal:     Cervical back: Normal range of motion.     Right lower leg: No edema.     Left lower leg: No edema.  Skin:    General: Skin is warm and dry.     Capillary Refill: Capillary refill takes less than 2 seconds.  Neurological:     Mental Status: She is alert. Mental status is at baseline.  Psychiatric:        Mood and Affect: Mood normal.        Behavior: Behavior normal.     ED Results / Procedures / Treatments   Labs (all labs ordered are listed, but only abnormal results are displayed) Labs Reviewed  COMPREHENSIVE METABOLIC PANEL - Abnormal; Notable for the  following components:      Result Value   CO2 21 (*)    Glucose, Bld 133 (*)    All other components within normal limits  CBC WITH DIFFERENTIAL/PLATELET - Abnormal; Notable for the following components:   WBC 11.5 (*)    RBC 5.66 (*)    MCV 74.6 (*)    MCH 23.0 (*)    RDW 16.1 (*)    Neutro Abs 10.2 (*)    Abs Immature Granulocytes 0.09 (*)    All other components within normal limits  RESPIRATORY PANEL BY RT PCR (FLU A&B, COVID)  D-DIMER, QUANTITATIVE (NOT AT [facility])  PREGNANCY, URINE  MAGNESIUM  HCG, SERUM, QUALITATIVE  TROPONIN I (HIGH SENSITIVITY)    EKG EKG Interpretation  Date/Time:  Saturday August 25 2019 10:23:24 EDT Ventricular Rate:  74 PR Interval:  136 QRS Duration: 90 QT Interval:  402 QTC Calculation: 446 R Axis:   73 Text Interpretation: Normal sinus rhythm Normal ECG No significant change since last tracing Confirmed by Frederick Peers [phone]) on 08/25/2019 12:29:02 PM   Radiology DG Chest 2 View  Result Date: 08/25/2019 CLINICAL DATA:  Shortness of breath. EXAM: CHEST - 2 VIEW COMPARISON:  08/13/2017 FINDINGS: Cardiomediastinal contours and hilar structures are normal. Lungs are clear. No sign of pleural effusion. Visualized skeletal structures are unremarkable. IMPRESSION: No active cardiopulmonary disease. Electronically Signed   By: Donzetta Kohut M.D.   On: 08/25/2019 11:37    Procedures Procedures (including critical care time)  Medications Ordered in ED Medications  magnesium sulfate IVPB 2 g 50 mL (0 g Intravenous Stopped 08/25/19 1309)  albuterol (VENTOLIN HFA) 108 (90 Base) MCG/ACT inhaler 8 puff (2 puffs Inhalation Given 08/25/19 1036)    ED Course  I have reviewed the triage vital signs and the nursing notes.  Pertinent labs & imaging results that  were available during my care of the patient were reviewed by me and considered in my medical decision making (see chart for details).   Clinical Course as of Aug 25 1350  Sat Aug 25, 2019    1225 CBC with mild leukocytosis but also with evidence of dehydration with elevated RBC.  Patient is likely dehydrated.  She states that she has not drink very much water today or yesterday.  Doubt infection.  Dimer is negative.  Confirmed to be unlikely to be pulmonary embolism/DVT.  CBC WITH DIFFERENTIAL(!) [WF]  1226 Chest x-ray intimately viewed by myself.  No evidence of infection or acute abnormality.  DG Chest 2 View [WF]    Clinical Course User Index [WF] Gailen Shelter, [place]   MDM Rules/Calculators/A&P                       Patient is 19 year old female presented today for chest pain shortness of breath.  She has a history of asthma, anxiety, no history of anemia, no history of DVT or VTE.  She is PERC negative and I highly doubt pulmonary embolism her oxygen saturations are 98% on room air however patient states she feels quite short of breath and is endorsing pleuritic chest pain and states that she has left calf tenderness and sister at bedside is very concerned for VTE as well.  Shared decision-making conversation held with family and patient.  They are agreeable to dimer and reassessment.  In the meantime I will treat patient with magnesium, repeat albuterol treatments obtain basic labs, chest x-ray and EKG.  Covid test negative.  Pregnancy test negative.  Patient is significantly improved after symptomatic treatment.  No indication to send her home with prednisone at this time as she is feeling well breathing well oxygen at 100% on room air, she was only wheezing slightly during my initial evaluation now has no wheezing at all.  I did share decision-making irritation with patient about prednisone at discharge.  Because of its weight increase effects mother and daughter state they would prefer not to have it.  They will follow up closely with her PCP for reassessment next week.  Final Clinical Impression(s) / ED Diagnoses Final diagnoses:  Mild intermittent asthma with acute  exacerbation    Rx / DC Orders ED Discharge Orders    None       Gailen Shelter, [place] 08/25/19 1354    Little, Ambrose [place], MD 08/26/19 [phone]

## 2019-09-18 ENCOUNTER — Ambulatory Visit: Payer: Medicaid Other | Admitting: Allergy & Immunology

## 2019-09-19 ENCOUNTER — Telehealth (INDEPENDENT_AMBULATORY_CARE_PROVIDER_SITE_OTHER): Payer: Medicaid Other | Admitting: Family Medicine

## 2019-09-19 DIAGNOSIS — E282 Polycystic ovarian syndrome: Secondary | ICD-10-CM

## 2019-09-19 DIAGNOSIS — Z760 Encounter for issue of repeat prescription: Secondary | ICD-10-CM

## 2019-09-19 NOTE — Telephone Encounter (Addendum)
Called patient to see what her concerns are with her OCP's.   She reports she started taking the OCP's in the last month. She has not had a period in 5 months. She is on day 4 of the placebo pills and is having brown spotting with some slight red. Bleeding just started last night.   She has felt some pressure in her abdomen with cramping noted.   Advised her it may take some time for the pills to regulate her cycles.   Last week she reports she had some gagging feeling. She takes her OCP in the morning before work. Enc her to take with food.   Advised that this is normal and to conitinue with her pills and to follow up with Dr. Adrian Blackwater in about 3 months or sooner if needed. Patient voiced understanding.

## 2019-09-19 NOTE — Telephone Encounter (Signed)
Patient called in stating she would like to speak to Dr. Adrian Blackwater about some questions she has about her birth control that she has. Patient instructed that Dr. Adrian Blackwater is not in the office, but I will send a message to the nurses and they will contact her as soon as they can. Patient verbalized understanding and message sent to clinical pool.

## 2019-09-24 MED ORDER — IBUPROFEN 600 MG PO TABS
600.0000 mg | ORAL_TABLET | Freq: Four times a day (QID) | ORAL | 3 refills | Status: DC | PRN
Start: 2019-09-24 — End: 2021-08-24

## 2019-09-24 NOTE — Telephone Encounter (Signed)
Called patient - having bad cramps. Sent prescription for ibuprofen 600mg  to pharmacy. Take every 6 hours. Has been bleeding for 5 days. On second day of new pack. COntinue with OCPs.

## 2019-09-24 NOTE — Addendum Note (Signed)
Addended by: Levie Heritage on: 09/24/2019 04:58 PM   Modules accepted: Orders

## 2019-09-27 ENCOUNTER — Ambulatory Visit: Payer: Medicaid Other | Admitting: Allergy & Immunology

## 2019-10-02 ENCOUNTER — Ambulatory Visit: Payer: Medicaid Other | Admitting: Allergy & Immunology

## 2019-10-04 ENCOUNTER — Telehealth (INDEPENDENT_AMBULATORY_CARE_PROVIDER_SITE_OTHER): Payer: Medicaid Other | Admitting: Lactation Services

## 2019-10-04 DIAGNOSIS — N939 Abnormal uterine and vaginal bleeding, unspecified: Secondary | ICD-10-CM

## 2019-10-04 NOTE — Telephone Encounter (Signed)
I have reviewed this chart and agree with the RN/CMA assessment and management.    K. Meryl Khup Sapia, M.D. Attending [facility] (Faculty Practice)   

## 2019-10-04 NOTE — Telephone Encounter (Signed)
Returned patients call.   Patient reports she talked with another provider yesterday and lost her OCP for 1 week from 5/28-now. She had unprotected sexual intercourse while not on OCP's. She is bleeding again since yesterday morning. Bleeding is like a real period for her. She took a pregnancy test which was negative.   She called and spoke with the after hours line who told her to stop taking the pills and call the office.   Spoke with Dr. Earlene Plater. She advised that patient can resume her OCP's and take a pregnancy test in 2 weeks. She also advised to use back up birthcontrol for 2 weeks after birthcontrol is resumed.   Relayed information to patient. She is concerned with taking OCP's if pregnant, advised what Dr. Earlene Plater recommended. Patient was informed if she does not resume taking the pills to take a pregnancy test in 2 weeks and use back up birthcontrol until pills are resumed for 2 weeks.   Patient voiced understanding.

## 2019-10-11 ENCOUNTER — Ambulatory Visit: Payer: Medicaid Other | Admitting: Allergy & Immunology

## 2019-12-11 ENCOUNTER — Ambulatory Visit: Payer: No Typology Code available for payment source | Admitting: Allergy & Immunology

## 2019-12-18 ENCOUNTER — Encounter: Payer: Medicaid Other | Admitting: Advanced Practice Midwife

## 2019-12-25 ENCOUNTER — Ambulatory Visit: Payer: No Typology Code available for payment source | Admitting: Allergy & Immunology

## 2020-01-17 ENCOUNTER — Ambulatory Visit: Payer: No Typology Code available for payment source | Admitting: Allergy & Immunology

## 2020-01-24 ENCOUNTER — Ambulatory Visit (INDEPENDENT_AMBULATORY_CARE_PROVIDER_SITE_OTHER): Payer: Medicaid Other | Admitting: Family Medicine

## 2020-01-24 ENCOUNTER — Other Ambulatory Visit: Payer: Self-pay

## 2020-01-24 ENCOUNTER — Encounter: Payer: Self-pay | Admitting: Family Medicine

## 2020-01-24 VITALS — BP 131/97 | HR 76 | Ht 62.5 in | Wt 273.0 lb

## 2020-01-24 DIAGNOSIS — N912 Amenorrhea, unspecified: Secondary | ICD-10-CM | POA: Diagnosis not present

## 2020-01-24 DIAGNOSIS — E282 Polycystic ovarian syndrome: Secondary | ICD-10-CM | POA: Diagnosis not present

## 2020-01-24 DIAGNOSIS — R102 Pelvic and perineal pain: Secondary | ICD-10-CM | POA: Diagnosis not present

## 2020-01-24 LAB — POCT URINE PREGNANCY: Preg Test, Ur: NEGATIVE

## 2020-01-24 MED ORDER — METFORMIN HCL 500 MG PO TABS: ORAL_TABLET | ORAL | 5 refills | Status: DC

## 2020-01-24 NOTE — Progress Notes (Signed)
   Subjective:    Patient ID: Tracy Crawford, female    DOB: 2000/09/11, 19 y.o.   MRN: [id number]  HPI  Seen for irregular bleeding. Had periods when she was on the birth control, then had a bleeding after she stopped. Hasn't had a period since July when she stopped the OCPs. Is interested in becoming pregnant.  Also having moderate to severe pelvic pain fairly constantly. No abnormal discharge.  Review of Systems     Objective:   Physical Exam Vitals reviewed.  HENT:     Head: Normocephalic and atraumatic.  Cardiovascular:     Rate and Rhythm: Normal rate and regular rhythm.     Pulses: Normal pulses.     Heart sounds: Normal heart sounds.  Psychiatric:        Mood and Affect: Mood normal.        Thought Content: Thought content normal.        Judgment: Judgment normal.        Assessment & Plan:  1. Amenorrhea UPT negative - POCT urine pregnancy  2. PCOS (polycystic ovarian syndrome) Source of irregular periods. Start metformin in order to maximize fertility.  3. Pelvic pain - US PELVIC COMPLETE WITH TRANSVAGINAL; Future

## 2020-01-24 NOTE — Progress Notes (Signed)
Patient states that she missed some pills and had two periods in one month. Then her LMP 10-02-2019.   Patient has not been taking any birth control since then. Armandina Stammer RN

## 2020-01-29 ENCOUNTER — Ambulatory Visit (INDEPENDENT_AMBULATORY_CARE_PROVIDER_SITE_OTHER): Payer: Medicaid Other | Admitting: Family Medicine

## 2020-01-29 ENCOUNTER — Encounter: Payer: Self-pay | Admitting: Family Medicine

## 2020-01-29 ENCOUNTER — Other Ambulatory Visit: Payer: Self-pay

## 2020-01-29 VITALS — BP 124/80 | HR 80 | Temp 98.3°F | Resp 16 | Ht 62.5 in | Wt 276.2 lb

## 2020-01-29 DIAGNOSIS — H1013 Acute atopic conjunctivitis, bilateral: Secondary | ICD-10-CM | POA: Diagnosis not present

## 2020-01-29 DIAGNOSIS — J3089 Other allergic rhinitis: Secondary | ICD-10-CM | POA: Insufficient documentation

## 2020-01-29 DIAGNOSIS — J302 Other seasonal allergic rhinitis: Secondary | ICD-10-CM

## 2020-01-29 DIAGNOSIS — T7800XD Anaphylactic reaction due to unspecified food, subsequent encounter: Secondary | ICD-10-CM | POA: Diagnosis not present

## 2020-01-29 DIAGNOSIS — J4540 Moderate persistent asthma, uncomplicated: Secondary | ICD-10-CM

## 2020-01-29 MED ORDER — LEVOCETIRIZINE DIHYDROCHLORIDE 5 MG PO TABS
5.0000 mg | ORAL_TABLET | Freq: Every evening | ORAL | 5 refills | Status: DC
Start: 2020-01-29 — End: 2020-07-04

## 2020-01-29 MED ORDER — OMEPRAZOLE 20 MG PO CPDR
DELAYED_RELEASE_CAPSULE | ORAL | 5 refills | Status: DC
Start: 2020-01-29 — End: 2021-08-24

## 2020-01-29 MED ORDER — MONTELUKAST SODIUM 10 MG PO TABS: 10.0000 mg | ORAL_TABLET | Freq: Every day | ORAL | 5 refills | Status: DC

## 2020-01-29 MED ORDER — EPINEPHRINE 0.3 MG/0.3ML IJ SOAJ: INTRAMUSCULAR | 1 refills | Status: DC

## 2020-01-29 MED ORDER — ALBUTEROL SULFATE HFA 108 (90 BASE) MCG/ACT IN AERS: 2.0000 | INHALATION_SPRAY | RESPIRATORY_TRACT | 0 refills | Status: DC | PRN

## 2020-01-29 MED ORDER — BREZTRI AEROSPHERE 160-9-4.8 MCG/ACT IN AERO: INHALATION_SPRAY | RESPIRATORY_TRACT | 5 refills | Status: DC

## 2020-01-29 MED ORDER — FLUTICASONE PROPIONATE 50 MCG/ACT NA SUSP: 1.0000 | Freq: Every day | NASAL | 5 refills | Status: DC

## 2020-01-29 NOTE — Patient Instructions (Signed)
Asthma Continue montelukast 10 mg once a day to prevent cough or wheeze Stop Symbicort and begin Breztri- 2 puffs twice a day with a spacer to prevent cough or wheeze Continue albuterol 2 puffs every 4 hours as needed for cough or wheeze OR Instead use albuterol 0.083% solution via nebulizer one unit vial every 4 hours as needed for cough or wheeze  Allergic rhinitis Stop cetirizine and begin levocetirizine 5 mg once a day as needed for a runny nose Begin Flonase 1-2 sprays in each nostril once a day as needed for a stuffy nose Consider saline nasal rinses as needed for nasal symptoms. Use this before any medicated nasal sprays for best result Return to the clinic for allergy testing to environmental allergens when it is convenient for you if you are interested. Remember to stop antihistamines for 3 days before the allergy testing appointment.  Consider allergy injections.   Allergic conjunctivitis Continue Pazeo one drop in each eye once a day as needed for red, itchy eyes. Some over the counter eye drops include Pataday one drop in each eye once a day as needed for red, itchy eyes OR Zaditor one drop in each eye twice a day as needed for red itchy eyes.  Reflux Begin omeprazole 20 mg once a day before breakfast to control reflux Continue dietary and lifestyle modifications as listed below  Food allergy Continue to avoid shellfish and tree nuts. In case of an allergic reaction, take Benadryl 50 mg every 4 hours, and if life-threatening symptoms occur, inject with EpiPen 0.3 mg. Consider retesting to shellfish and tree nuts if you are interested.  Oral allergy syndrome Continue to avoid fruits that bother your mouth. In case of an allergic reaction, take Benadryl 50 mg every 4 hours, and if life-threatening symptoms occur, inject with EpiPen 0.3 mg.  Call the clinic if this treatment plan is not working well for you  Follow up in 3 months or sooner if needed.

## 2020-01-29 NOTE — Progress Notes (Addendum)
[address] [place] [address] Dept: [phone]  FOLLOW UP NOTE  Patient ID: Tracy Crawford, female    DOB: 08/01/2000  Age: 19 y.o. MRN: [id number] Date of Office Visit: 01/29/2020  Assessment  Chief Complaint: Asthma (Asthma attack a couple of months ago, nothing helped. There was some relief after the third breathing treatment. Symbicort does nothing with her asthma.) and Allergic Rhinitis  (Says they are bad to where she cannot come out of the house)  HPI Tracy Crawford is a 19 year old female who presents to the clinic for follow-up visit.  She was last seen in this clinic on 03/16/2019 for evaluation of asthma, allergic rhinitis, allergic conjunctivitis, and food allergy to shellfish, tree nuts, and fruits.  At today's visit she reports asthma has not been well controlled with shortness of breath with activity and talking, wheezing more than usual, and coughing that is occasionally dry and occasionally produces clear mucus.  She continues montelukast 10 mg once a day, Symbicort 160-2 puffs twice a day without a spacer, and albuterol as needed.  She reports that she has been out of montelukast and albuterol for 1 month.  She reports weather changes aggravate her asthma.  Reflux is reported as not well controlled with an increase in heartburn over the last few months.  She is not taking a medication to control reflux at this time.  Allergic rhinitis is reported as poorly controlled with nasal congestion, clear rhinorrhea, and frequent sneezing.  She continues cetirizine 10 mg once a day and Flonase as needed.  She is not currently using a nasal saline rinse.  Allergic conjunctivitis is reported as moderately well controlled with symptoms including red, itchy eyes with clear drainage for which she uses Pataday with relief of symptoms.  She continues to avoid shellfish, tree nuts, and fruit with no accidental ingestion since her last visit to this clinic.  She reports that food makes her  mouth and throat itchy.  Her current medications are listed in the chart.   Drug Allergies:  Allergies  Allergen Reactions  . Shellfish Allergy Anaphylaxis  . Apple Itching  . Augmentin [Amoxicillin-Pot Clavulanate] Hives    Has patient had a PCN reaction causing immediate rash, facial/tongue/throat swelling, SOB or lightheadedness with hypotension: Unknown Has patient had a PCN reaction causing severe rash involving mucus membranes or skin necrosis: Unknown Has patient had a PCN reaction that required hospitalization: Unknown Has patient had a PCN reaction occurring within the last 10 years: Yes If all of the above answers are "NO", then may proceed with Cephalosporin use.   . Banana Itching  . Ceftin [Cefuroxime Axetil] Hives    Unknown reaction  . Cherry   . Peach [Prunus Persica] Itching  . Plum Pulp Itching  . Watermelon [Citrullus Vulgaris] Itching    Physical Exam: BP 124/80   Pulse 80   Temp 98.3 F (36.8 C)   Resp 16   Ht 5' 2.5" (1.588 m)   Wt 276 lb 3.2 oz (125.3 kg)   SpO2 98%   BMI 49.71 kg/m    Physical Exam Vitals reviewed.  Constitutional:      Appearance: Normal appearance.  HENT:     Head: Normocephalic and atraumatic.     Right Ear: Tympanic membrane normal.     Left Ear: Tympanic membrane normal.     Nose:     Comments: Bilateral nares edematous and pale with clear nasal drainage.  Pharynx slightly erythematous with no exudate.  Ears normal.  Eyes normal.    Mouth/Throat:     Pharynx: Oropharynx is clear.  Eyes:     Conjunctiva/sclera: Conjunctivae normal.  Cardiovascular:     Rate and Rhythm: Normal rate and regular rhythm.     Heart sounds: Normal heart sounds. No murmur heard.   Pulmonary:     Effort: Pulmonary effort is normal.     Breath sounds: Normal breath sounds.     Comments: Lungs clear to auscultation Musculoskeletal:        General: Normal range of motion.     Cervical back: Normal range of motion and neck supple.  Skin:     General: Skin is warm and dry.  Neurological:     Mental Status: She is alert and oriented to person, place, and time.  Psychiatric:        Mood and Affect: Mood normal.        Behavior: Behavior normal.        Thought Content: Thought content normal.        Judgment: Judgment normal.     Diagnostics: FVC 4.07, FEV1 3.17.  Predicted FVC 3.05, predicted FEV1 2.72.  Spirometry indicates normal ventilatory function.  Assessment and Plan: 1. Moderate persistent asthma, unspecified whether complicated   2. Seasonal and perennial allergic rhinitis   3. Allergic conjunctivitis of both eyes   4. Anaphylactic shock due to food, subsequent encounter     Meds ordered this encounter  Medications  . Budeson-Glycopyrrol-Formoterol (BREZTRI AEROSPHERE) 160-9-4.8 MCG/ACT AERO    Sig: 2 puffs twice a day    Dispense:  11 g    Refill:  5  . levocetirizine (XYZAL) 5 MG tablet    Sig: Take 1 tablet (5 mg total) by mouth every evening.    Dispense:  30 tablet    Refill:  5  . albuterol (VENTOLIN HFA) 108 (90 Base) MCG/ACT inhaler    Sig: Inhale 2 puffs into the lungs every 4 (four) hours as needed.    Dispense:  8.5 g    Refill:  0    Please remind pt to schedule f/u appointment, no further refills.  Marland Kitchen EPINEPHrine 0.3 mg/0.3 mL IJ SOAJ injection    Sig: SMARTSIG:0.3 Milliliter(s) IM Once    Dispense:  1 each    Refill:  1    Please dispense mylan or teva brand  . fluticasone (FLONASE) 50 MCG/ACT nasal spray    Sig: Place 1-2 sprays into both nostrils daily. As needed.    Dispense:  1 g    Refill:  5  . montelukast (SINGULAIR) 10 MG tablet    Sig: Take 1 tablet (10 mg total) by mouth at bedtime.    Dispense:  30 tablet    Refill:  5  . omeprazole (PRILOSEC) 20 MG capsule    Sig: Take one tablet daily before breakfast    Dispense:  30 capsule    Refill:  5    Patient Instructions  Asthma Continue montelukast 10 mg once a day to prevent cough or wheeze Stop Symbicort and begin  Breztri- 2 puffs twice a day with a spacer to prevent cough or wheeze Continue albuterol 2 puffs every 4 hours as needed for cough or wheeze OR Instead use albuterol 0.083% solution via nebulizer one unit vial every 4 hours as needed for cough or wheeze  Allergic rhinitis Stop cetirizine and begin levocetirizine 5 mg once a day as needed for a runny nose Begin Flonase 1-2 sprays in each nostril  once a day as needed for a stuffy nose Consider saline nasal rinses as needed for nasal symptoms. Use this before any medicated nasal sprays for best result Return to the clinic for allergy testing to environmental allergens when it is convenient for you if you are interested. Remember to stop antihistamines for 3 days before the allergy testing appointment.  Consider allergy injections.   Allergic conjunctivitis Continue Pazeo one drop in each eye once a day as needed for red, itchy eyes. Some over the counter eye drops include Pataday one drop in each eye once a day as needed for red, itchy eyes OR Zaditor one drop in each eye twice a day as needed for red itchy eyes.  Reflux Begin omeprazole 20 mg once a day before breakfast to control reflux Continue dietary and lifestyle modifications as listed below  Food allergy Continue to avoid shellfish and tree nuts. In case of an allergic reaction, take Benadryl 50 mg every 4 hours, and if life-threatening symptoms occur, inject with EpiPen 0.3 mg. Consider retesting to shellfish and tree nuts if you are interested.  Oral allergy syndrome Continue to avoid fruits that bother your mouth. In case of an allergic reaction, take Benadryl 50 mg every 4 hours, and if life-threatening symptoms occur, inject with EpiPen 0.3 mg.  Call the clinic if this treatment plan is not working well for you  Follow up in 3 months or sooner if needed.   Return in about 3 months (around 04/29/2020), or if symptoms worsen or fail to improve.    Thank you for the opportunity  to care for this patient.  Please do not hesitate to contact me with questions.  Thermon Leyland, FNP [facility]  I have provided oversight concerning Thermon Leyland' evaluation and treatment of this patient's health issues addressed during today's encounter. I agree with the assessment and therapeutic plan as outlined in the note.   Signed,   Jessica Priest, MD,  Allergy and Immunology,  [facility] [place].

## 2020-01-30 ENCOUNTER — Encounter: Payer: Self-pay | Admitting: Family Medicine

## 2020-01-31 ENCOUNTER — Ambulatory Visit (HOSPITAL_BASED_OUTPATIENT_CLINIC_OR_DEPARTMENT_OTHER): Payer: Medicaid Other

## 2020-02-01 ENCOUNTER — Ambulatory Visit (HOSPITAL_BASED_OUTPATIENT_CLINIC_OR_DEPARTMENT_OTHER): Payer: Medicaid Other

## 2020-02-04 ENCOUNTER — Ambulatory Visit (HOSPITAL_BASED_OUTPATIENT_CLINIC_OR_DEPARTMENT_OTHER): Admission: RE | Admit: 2020-02-04 | Payer: Medicaid Other | Source: Ambulatory Visit

## 2020-02-13 ENCOUNTER — Other Ambulatory Visit: Payer: Self-pay

## 2020-02-13 ENCOUNTER — Ambulatory Visit (HOSPITAL_BASED_OUTPATIENT_CLINIC_OR_DEPARTMENT_OTHER)
Admission: RE | Admit: 2020-02-13 | Discharge: 2020-02-13 | Disposition: A | Discharge status: Home / Self Care | Payer: Medicaid Other | Source: Ambulatory Visit | Attending: Family Medicine | Admitting: Family Medicine

## 2020-02-13 DIAGNOSIS — R102 Pelvic and perineal pain: Secondary | ICD-10-CM | POA: Diagnosis present

## 2020-04-02 ENCOUNTER — Other Ambulatory Visit: Payer: Self-pay | Admitting: Family Medicine

## 2020-04-02 NOTE — Telephone Encounter (Signed)
Patient called requesting a prescription, but she would like the Pro Air instead of the Ventolin.

## 2020-04-08 ENCOUNTER — Telehealth: Payer: Self-pay | Admitting: *Deleted

## 2020-04-08 NOTE — Telephone Encounter (Signed)
PA has been submitted through [employer] for Breztri and is currently suspended/ pending.  °

## 2020-04-09 NOTE — Telephone Encounter (Signed)
PA is still pending.  

## 2020-04-14 NOTE — Telephone Encounter (Signed)
PA was approved for [employer]. PA form has been faxed to pharmacy, labeled, and placed in bulk scanning.

## 2020-04-18 ENCOUNTER — Telehealth: Payer: Self-pay

## 2020-04-18 ENCOUNTER — Other Ambulatory Visit: Payer: Self-pay | Admitting: Family Medicine

## 2020-04-18 MED ORDER — PROAIR HFA 108 (90 BASE) MCG/ACT IN AERS: INHALATION_SPRAY | RESPIRATORY_TRACT | 1 refills | Status: DC

## 2020-04-18 NOTE — Telephone Encounter (Signed)
Is there a Breztri sample in the [place] clinic that she could pick up?

## 2020-04-18 NOTE — Telephone Encounter (Signed)
I called patient and she did pick up her inhalers. She agreed with the plan and will call us back if she has any problems

## 2020-04-18 NOTE — Telephone Encounter (Signed)
Patient called stating she is having shortness of breath and wheezing. It started 2-3 days ago and she is getting worse. Patient states she does not have any refills on her inhalers and was upset about that. I called the pharmacy and they did have the [facility] ready for her. The only refill they needed was the rescue inhaler. I sent in the refill for the ProAir. I will call and let patient know refills are at the pharmacy for her. She also wanted to know if there was anything else she can take for this issue. Please advice.

## 2020-04-18 NOTE — Telephone Encounter (Signed)
Oh OK. Was she out of inhalers over the last few days?

## 2020-04-18 NOTE — Telephone Encounter (Signed)
We do have some samples but the pharmacy already has her Breztri ready for her to pick up and patient was informed.

## 2020-04-18 NOTE — Telephone Encounter (Signed)
She states she was only using the rescue inhaler.

## 2020-04-18 NOTE — Telephone Encounter (Signed)
OK perfect. Please have her start the maintenance inhaler 2 puffs twice a day with a spacer. And she can continue albuterol 2 puffs every 4 hours as needed. If she is not feeling better with this please have her call the clinic for further treatment. Thank you

## 2020-04-29 ENCOUNTER — Ambulatory Visit: Payer: Medicaid Other | Admitting: Allergy & Immunology

## 2020-05-08 ENCOUNTER — Ambulatory Visit: Payer: Medicaid Other | Admitting: Allergy & Immunology

## 2020-05-08 ENCOUNTER — Telehealth: Payer: Self-pay

## 2020-05-08 NOTE — Telephone Encounter (Signed)
Called patient to notify them of their missed appointment and to see if they wanted to reschedule. Patient's mailbox is full.

## 2020-05-20 ENCOUNTER — Ambulatory Visit: Payer: Medicaid Other | Admitting: Allergy & Immunology

## 2020-06-03 ENCOUNTER — Ambulatory Visit: Payer: Medicaid Other

## 2020-07-04 ENCOUNTER — Encounter: Payer: Self-pay | Admitting: Allergy

## 2020-07-04 ENCOUNTER — Ambulatory Visit (INDEPENDENT_AMBULATORY_CARE_PROVIDER_SITE_OTHER): Payer: Medicaid Other | Admitting: Allergy

## 2020-07-04 ENCOUNTER — Other Ambulatory Visit: Payer: Self-pay

## 2020-07-04 VITALS — BP 110/80 | HR 71 | Temp 97.5°F | Resp 18 | Ht 62.5 in | Wt 265.8 lb

## 2020-07-04 DIAGNOSIS — H1013 Acute atopic conjunctivitis, bilateral: Secondary | ICD-10-CM | POA: Diagnosis not present

## 2020-07-04 DIAGNOSIS — J4541 Moderate persistent asthma with (acute) exacerbation: Secondary | ICD-10-CM | POA: Diagnosis not present

## 2020-07-04 DIAGNOSIS — J3089 Other allergic rhinitis: Secondary | ICD-10-CM | POA: Diagnosis not present

## 2020-07-04 DIAGNOSIS — T7800XD Anaphylactic reaction due to unspecified food, subsequent encounter: Secondary | ICD-10-CM | POA: Diagnosis not present

## 2020-07-04 DIAGNOSIS — T781XXD Other adverse food reactions, not elsewhere classified, subsequent encounter: Secondary | ICD-10-CM

## 2020-07-04 DIAGNOSIS — J302 Other seasonal allergic rhinitis: Secondary | ICD-10-CM

## 2020-07-04 MED ORDER — MONTELUKAST SODIUM 10 MG PO TABS: 10.0000 mg | ORAL_TABLET | Freq: Every day | ORAL | 5 refills | Status: DC

## 2020-07-04 MED ORDER — EPINEPHRINE 0.3 MG/0.3ML IJ SOAJ: INTRAMUSCULAR | 1 refills | Status: DC

## 2020-07-04 MED ORDER — OLOPATADINE HCL 0.2 % OP SOLN
1.0000 [drp] | OPHTHALMIC | 5 refills | Status: DC
Start: 2020-07-04 — End: 2020-07-08

## 2020-07-04 MED ORDER — CETIRIZINE HCL 10 MG PO TABS
10.0000 mg | ORAL_TABLET | Freq: Every day | ORAL | 5 refills | Status: DC
Start: 2020-07-04 — End: 2021-08-24

## 2020-07-04 MED ORDER — PROAIR HFA 108 (90 BASE) MCG/ACT IN AERS: INHALATION_SPRAY | RESPIRATORY_TRACT | 0 refills | Status: DC

## 2020-07-04 MED ORDER — FLUTICASONE PROPIONATE 50 MCG/ACT NA SUSP: 1.0000 | Freq: Every day | NASAL | 5 refills | Status: DC

## 2020-07-04 MED ORDER — ALBUTEROL SULFATE HFA 108 (90 BASE) MCG/ACT IN AERS: 2.0000 | INHALATION_SPRAY | RESPIRATORY_TRACT | 5 refills | Status: DC | PRN

## 2020-07-04 MED ORDER — BREZTRI AEROSPHERE 160-9-4.8 MCG/ACT IN AERO: INHALATION_SPRAY | RESPIRATORY_TRACT | 5 refills | Status: DC

## 2020-07-04 NOTE — Patient Instructions (Addendum)
Asthma with acute exacerbation Continue montelukast 10 mg once a day to prevent cough or wheeze Continue Breztri- 2 puffs twice a day with a spacer to prevent cough or wheeze Continue albuterol 2 puffs every 4 hours as needed for cough or wheeze OR Instead use albuterol 0.083% solution via nebulizer one unit vial every 4 hours as needed for cough or wheeze Take Prednisone 5 day burst as outlined on pack Obtain labwork: CBC with diff to look at your degree of eosinophils  Allergic rhinitis Stop levocetirizine as not effective.  Resume Cetirizine 10mg  daily use (may take additional tab if needed in a day) Continue Flonase 2 sprays in each nostril once a day for 1-2 weeks at a time before stopping once nasal congestion symptoms improve.   Prednisone as above will help with nasal congestion. Consider saline nasal rinses as needed for nasal symptoms. Use this before any medicated nasal sprays for best result  Allergic conjunctivitis Continue Pataday one drop in each eye once a day as needed for red, itchy eyes.   Food allergy Continue to avoid shellfish and tree nuts. In case of an allergic reaction, take Benadryl 50 mg every 4 hours, and if life-threatening symptoms occur, inject with EpiPen 0.3 mg. Consider retesting to shellfish and tree nuts if you are interested.  Oral allergy syndrome Continue to avoid fruits that bother your mouth. In case of an allergic reaction, take Benadryl 50 mg every 4 hours, and if life-threatening symptoms occur, inject with EpiPen 0.3 mg.  Call the clinic if this treatment plan is not working well for you  Follow up in 3 months or sooner if needed.

## 2020-07-04 NOTE — Progress Notes (Signed)
Follow-up Note  RE: Tracy Crawford MRN: [id number] DOB: Mar 27, 2001 Date of Office Visit: 07/04/2020   History of present illness: Tracy Crawford is a 20 y.o. female presenting today for follow-up of asthma, allergic rhinitis with conjunctivitis and food allergy.  She was last seen in the office on 01/29/2020 by FNP Ambs.    She states her asthma is not controlled.  She reports symptoms of wheezing, shortness of breath. She is using her albuterol 2 times in a day but does not need to use it every day and feels she uses it about 2-3 times a week over the past 2 weeks. This has been ongoing for the past 2 weeks.  She denies night time awakenings.  She believes the trigger is the changes in the weather. She is using breztri 2 puffs twice a day with spacer.    She also states her allergies are "acting up".  She is noting congestion and itchy/watery eyes.  She is taking xyzal currently.  She thinks cetirizine was working better.  She is using flonase as needed but states it does help when she uses it.  She is out of her eye drops (pataday) which helps her itchy/watery eyes.    She is not taking omeprazole at this time.    She is still avoiding shellfish and tree nuts as well as some fruits.  She has a epipen but needs a new current one.      Review of systems: Review of Systems  Constitutional: Negative.   HENT: Positive for congestion.   Eyes:       Itchy eyes  Respiratory: Positive for shortness of breath and wheezing.   Cardiovascular: Negative.   Gastrointestinal: Negative.   Musculoskeletal: Negative.   Skin: Negative.   Neurological: Negative.     All other systems negative unless noted above in HPI  Past medical/social/surgical/family history have been reviewed and are unchanged unless specifically indicated below.  No changes  Medication List: Current Outpatient Medications  Medication Sig Dispense Refill  . cetirizine (ZYRTEC) 10 MG tablet Take 1 tablet (10 mg total) by  mouth daily. Take one tablet once daily as directed.  Can take twice daily if needed. 60 tablet 5  . hydrochlorothiazide (HYDRODIURIL) 25 MG tablet Take 1 tablet (25 mg total) by mouth daily. 6 tablet 0  . ibuprofen (ADVIL) 600 MG tablet Take 1 tablet (600 mg total) by mouth every 6 (six) hours as needed. 60 tablet 3  . metFORMIN (GLUCOPHAGE) 500 MG tablet Take one tablet by mouth daily for one week. Then increase to one tablet twice a day for one week.  Then one tablets three times a day. 90 tablet 5  . montelukast (SINGULAIR) 10 MG tablet Take 0.5 tablets (5 mg total) by mouth at bedtime. 15 tablet 0  . norgestimate-ethinyl estradiol (ORTHO-CYCLEN) 0.25-35 MG-MCG tablet Take 1 tablet by mouth daily. 3 Package 3  . Olopatadine HCl (PATADAY) 0.2 % SOLN Place 1 drop into both eyes 1 day or 1 dose. 2.5 mL 5  . omeprazole (PRILOSEC) 20 MG capsule Take one tablet daily before breakfast 30 capsule 5  . PROAIR HFA 108 (90 Base) MCG/ACT inhaler INHALE 2 PUFFS INTO THE LUNGS EVERY 4 HOURS AS NEEDED 18 g 1  . albuterol (VENTOLIN HFA) 108 (90 Base) MCG/ACT inhaler Inhale 2 puffs into the lungs every 4 (four) hours as needed for wheezing or shortness of breath. 18 g 5  . Budeson-Glycopyrrol-Formoterol (BREZTRI AEROSPHERE) 160-9-4.8 MCG/ACT AERO 2  puffs twice a day 11 g 5  . EPINEPHrine 0.3 mg/0.3 mL IJ SOAJ injection SMARTSIG:0.3 Milliliter(s) IM Once 1 each 1  . fluticasone (FLONASE) 50 MCG/ACT nasal spray Place 1-2 sprays into both nostrils daily. As needed. 1 g 5  . montelukast (SINGULAIR) 10 MG tablet Take 1 tablet (10 mg total) by mouth at bedtime. 30 tablet 5  . PROAIR HFA 108 (90 Base) MCG/ACT inhaler INHALE 2 PUFFS INTO THE LUNGS EVERY 4 HOURS AS NEEDED 8.5 g 0   No current facility-administered medications for this visit.     Known medication allergies: Allergies  Allergen Reactions  . Shellfish Allergy Anaphylaxis  . Apple Itching  . Augmentin [Amoxicillin-Pot Clavulanate] Hives    Has  patient had a PCN reaction causing immediate rash, facial/tongue/throat swelling, SOB or lightheadedness with hypotension: Unknown Has patient had a PCN reaction causing severe rash involving mucus membranes or skin necrosis: Unknown Has patient had a PCN reaction that required hospitalization: Unknown Has patient had a PCN reaction occurring within the last 10 years: Yes If all of the above answers are "NO", then may proceed with Cephalosporin use.   . Banana Itching  . Ceftin [Cefuroxime Axetil] Hives    Unknown reaction  . Cherry   . Peach [Prunus Persica] Itching  . Plum Pulp Itching  . Watermelon [Citrullus Vulgaris] Itching     Physical examination: Blood pressure 110/80, pulse 71, temperature (!) 97.5 F (36.4 C), resp. rate 18, height 5' 2.5" (1.588 m), weight 265 lb 12.8 oz (120.6 kg), SpO2 98 %, unknown if currently breastfeeding.  General: Alert, interactive, in no acute distress. HEENT: PERRLA,  TMs pearly gray, turbinates markedly edematous with clear discharge, post-pharynx non erythematous. Neck: Supple without lymphadenopathy. Lungs: Clear to auscultation without wheezing, rhonchi or rales. {no increased work of breathing. CV: Normal S1, S2 without murmurs. Abdomen: Nondistended, nontender. Skin: Warm and dry, without lesions or rashes. Extremities:  No clubbing, cyanosis or edema. Neuro:   Grossly intact.  Diagnositics/Labs:  Spirometry: FEV1: 2.8L 103%, FVC: 3.76L 123%, ratio consistent with Nonobstructive pattern  Assessment and plan:   Asthma with acute exacerbation Continue montelukast 10 mg once a day to prevent cough or wheeze Continue Breztri- 2 puffs twice a day with a spacer to prevent cough or wheeze Continue albuterol 2 puffs every 4 hours as needed for cough or wheeze OR Instead use albuterol 0.083% solution via nebulizer one unit vial every 4 hours as needed for cough or wheeze Take Prednisone 5 day burst as outlined on pack Obtain labwork: CBC  with diff to look at your degree of eosinophils  Allergic rhinitis Stop levocetirizine as not effective.  Resume Cetirizine 10mg  daily use (may take additional tab if needed in a day) Continue Flonase 2 sprays in each nostril once a day for 1-2 weeks at a time before stopping once nasal congestion symptoms improve.   Prednisone as above will help with nasal congestion. Consider saline nasal rinses as needed for nasal symptoms. Use this before any medicated nasal sprays for best result  Allergic conjunctivitis Continue Pataday one drop in each eye once a day as needed for red, itchy eyes.   Food allergy Continue to avoid shellfish and tree nuts. In case of an allergic reaction, take Benadryl 50 mg every 4 hours, and if life-threatening symptoms occur, inject with EpiPen 0.3 mg. Consider retesting to shellfish and tree nuts if you are interested.  Oral allergy syndrome Continue to avoid fruits that bother your mouth. In  case of an allergic reaction, take Benadryl 50 mg every 4 hours, and if life-threatening symptoms occur, inject with EpiPen 0.3 mg.  Call the clinic if this treatment plan is not working well for you  Follow up in 3 months or sooner if needed.  I appreciate the opportunity to take part in Tracy Crawford's care. Please do not hesitate to contact me with questions.  Sincerely,   Margo Aye, MD Allergy/Immunology [facility]

## 2020-07-07 ENCOUNTER — Telehealth: Payer: Self-pay | Admitting: Allergy

## 2020-07-07 NOTE — Telephone Encounter (Signed)
Patient states she did not receive eye drops from the pharmacy. Informed patient that Pataday was called into the [facility] on [address] and patient states they did not have it.   Please advise.

## 2020-07-08 LAB — IGE NUT PROF. W/COMPONENT RFLX
F017-IgE Hazelnut (Filbert): 18.2 kU/L — AB
F018-IgE Brazil Nut: 0.24 kU/L — AB
F020-IgE Almond: 0.94 kU/L — AB
F202-IgE Cashew Nut: <0.1 kU/L
F203-IgE Pistachio Nut: 4.19 kU/L — AB
F256-IgE Walnut: 1.56 kU/L — AB
Macadamia Nut, IgE: 1.36 kU/L — AB
Peanut, IgE: 0.99 kU/L — AB
Pecan Nut IgE: 0.14 kU/L — AB

## 2020-07-08 LAB — PANEL 604726
Cor A 1 IgE: 29.1 kU/L — AB
Cor A 14 IgE: <0.1 kU/L
Cor A 8 IgE: <0.1 kU/L
Cor A 9 IgE: 0.16 kU/L — AB

## 2020-07-08 LAB — CBC WITH DIFFERENTIAL
Basophils Absolute: 0.1 10*3/uL (ref 0.0–0.2)
Basos: 1 %
EOS (ABSOLUTE): 0.5 10*3/uL — ABNORMAL HIGH (ref 0.0–0.4)
Eos: 4 %
Hematocrit: 40.6 % (ref 34.0–46.6)
Hemoglobin: 12.3 g/dL (ref 11.1–15.9)
Immature Grans (Abs): 0 10*3/uL (ref 0.0–0.1)
Immature Granulocytes: 0 %
Lymphocytes Absolute: 3.9 10*3/uL — ABNORMAL HIGH (ref 0.7–3.1)
Lymphs: 35 %
MCH: 23.7 pg — ABNORMAL LOW (ref 26.6–33.0)
MCHC: 30.3 g/dL — ABNORMAL LOW (ref 31.5–35.7)
MCV: 78 fL — ABNORMAL LOW (ref 79–97)
Monocytes Absolute: 0.6 10*3/uL (ref 0.1–0.9)
Monocytes: 6 %
Neutrophils Absolute: 6.1 10*3/uL (ref 1.4–7.0)
Neutrophils: 54 %
RBC: 5.2 x10E6/uL (ref 3.77–5.28)
RDW: 15.4 % (ref 11.7–15.4)
WBC: 11.2 10*3/uL — ABNORMAL HIGH (ref 3.4–10.8)

## 2020-07-08 LAB — PANEL 604721
Jug R 1 IgE: <0.1 kU/L
Jug R 3 IgE: <0.1 kU/L

## 2020-07-08 LAB — ALLERGEN PROFILE, SHELLFISH
Clam IgE: 3.08 kU/L — AB
F023-IgE Crab: 15.3 kU/L — AB
F080-IgE Lobster: 11.8 kU/L — AB
F290-IgE Oyster: 0.61 kU/L — AB
Scallop IgE: 2.44 kU/L — AB
Shrimp IgE: 19.3 kU/L — AB

## 2020-07-08 LAB — PEANUT COMPONENTS
F352-IgE Ara h 8: 0.62 kU/L — AB
F422-IgE Ara h 1: <0.1 kU/L
F423-IgE Ara h 2: <0.1 kU/L
F424-IgE Ara h 3: <0.1 kU/L
F427-IgE Ara h 9: <0.1 kU/L
F447-IgE Ara h 6: <0.1 kU/L

## 2020-07-08 LAB — PANEL 604350: Ber E 1 IgE: <0.1 kU/L

## 2020-07-08 LAB — ALLERGEN COMPONENT COMMENTS

## 2020-07-08 MED ORDER — OLOPATADINE HCL 0.2 % OP SOLN: 1.0000 [drp] | OPHTHALMIC | 3 refills | Status: DC

## 2020-07-08 NOTE — Telephone Encounter (Signed)
Pataday was sent in to [facility] on [address]  Patient was informed

## 2020-07-23 ENCOUNTER — Ambulatory Visit: Payer: Medicaid Other

## 2020-07-23 DIAGNOSIS — N912 Amenorrhea, unspecified: Secondary | ICD-10-CM

## 2020-07-23 NOTE — Progress Notes (Signed)
Patient sent to lab for blood draw. Karter Haire RN 

## 2020-07-24 ENCOUNTER — Telehealth: Payer: Self-pay

## 2020-07-24 LAB — BETA HCG QUANT (REF LAB): hCG Quant: <1 m[IU]/mL

## 2020-07-24 NOTE — Telephone Encounter (Signed)
Left message for patient to return call.   Patient returned call to office and made aware that HCG was negative. Patient wanted to schedule visit with provider to discuss amenorrhea. Armandina Stammer RN

## 2020-08-14 ENCOUNTER — Ambulatory Visit: Payer: Medicaid Other | Admitting: Family Medicine

## 2020-10-08 ENCOUNTER — Ambulatory Visit: Payer: No Typology Code available for payment source | Admitting: Family Medicine

## 2020-10-08 DIAGNOSIS — J309 Allergic rhinitis, unspecified: Secondary | ICD-10-CM

## 2020-10-08 NOTE — Progress Notes (Deleted)
   [address] [place] [address] Dept: [phone]  FOLLOW UP NOTE  Patient ID: Tracy Crawford, female    DOB: 09-Feb-2001  Age: 20 y.o. MRN: [id number] Date of Office Visit: 10/08/2020  Assessment  Chief Complaint: No chief complaint on file.  HPI Kem Parkinson    Drug Allergies:  Allergies  Allergen Reactions  . Shellfish Allergy Anaphylaxis  . Apple Itching  . Augmentin [Amoxicillin-Pot Clavulanate] Hives    Has patient had a PCN reaction causing immediate rash, facial/tongue/throat swelling, SOB or lightheadedness with hypotension: Unknown Has patient had a PCN reaction causing severe rash involving mucus membranes or skin necrosis: Unknown Has patient had a PCN reaction that required hospitalization: Unknown Has patient had a PCN reaction occurring within the last 10 years: Yes If all of the above answers are "NO", then may proceed with Cephalosporin use.   . Banana Itching  . Ceftin [Cefuroxime Axetil] Hives    Unknown reaction  . Cherry   . Peach [Prunus Persica] Itching  . Plum Pulp Itching  . Watermelon [Citrullus Vulgaris] Itching    Physical Exam: There were no vitals taken for this visit.   Physical Exam  Diagnostics:    Assessment and Plan: No diagnosis found.  No orders of the defined types were placed in this encounter.   There are no Patient Instructions on file for this visit.  No follow-ups on file.    Thank you for the opportunity to care for this patient.  Please do not hesitate to contact me with questions.  Thermon Leyland, FNP [facility]

## 2021-02-13 ENCOUNTER — Telehealth: Payer: Self-pay | Admitting: Allergy

## 2021-02-13 MED ORDER — PROAIR HFA 108 (90 BASE) MCG/ACT IN AERS: INHALATION_SPRAY | RESPIRATORY_TRACT | 1 refills | Status: DC

## 2021-02-13 MED ORDER — BREZTRI AEROSPHERE 160-9-4.8 MCG/ACT IN AERO: INHALATION_SPRAY | RESPIRATORY_TRACT | 5 refills | Status: DC

## 2021-02-13 NOTE — Telephone Encounter (Signed)
Refills for Albuterol rescue inhaler and Breztri have been sent to [facility] on [address]

## 2021-02-13 NOTE — Telephone Encounter (Signed)
Patient is requesting a refill on Albuterol and Breztri to help her until her OV. I did inform patient she needed a follow up visit and she scheduled for 10/17 with Chrissie.   Best pharmacy- [facility] on [address]

## 2021-02-16 ENCOUNTER — Ambulatory Visit: Payer: No Typology Code available for payment source | Admitting: Family

## 2021-03-02 ENCOUNTER — Other Ambulatory Visit: Payer: Self-pay

## 2021-03-02 ENCOUNTER — Encounter (HOSPITAL_BASED_OUTPATIENT_CLINIC_OR_DEPARTMENT_OTHER): Payer: Self-pay | Admitting: Oral Surgery

## 2021-03-09 NOTE — Anesthesia Preprocedure Evaluation (Addendum)
Anesthesia Evaluation  Patient identified by MRN, date of birth, ID band  Reviewed: Allergy & Precautions, NPO status , Patient's Chart, lab work & pertinent test results  Airway Mallampati: II  TM Distance: >3 FB Neck ROM: Full    Dental no notable dental hx. (+) Poor Dentition   Pulmonary asthma ,    Pulmonary exam normal breath sounds clear to auscultation       Cardiovascular Normal cardiovascular exam Rhythm:Regular Rate:Normal     Neuro/Psych negative neurological ROS     GI/Hepatic negative GI ROS, Neg liver ROS,   Endo/Other  Morbid obesity  Renal/GU negative Renal ROS     Musculoskeletal negative musculoskeletal ROS (+)   Abdominal (+) + obese (BMI 40.64),   Peds  Hematology   Anesthesia Other Findings All: augmentin  Reproductive/Obstetrics negative OB ROS                            Anesthesia Physical Anesthesia Plan  ASA: 3  Anesthesia Plan: General   Post-op Pain Management:    Induction: Intravenous  PONV Risk Score and Plan: 4 or greater and Treatment may vary due to age or medical condition, Midazolam, Dexamethasone and Ondansetron  Airway Management Planned: Oral ETT and Nasal ETT  Additional Equipment: None  Intra-op Plan:   Post-operative Plan: Extubation in OR  Informed Consent: I have reviewed the patients History and Physical, chart, labs and discussed the procedure including the risks, benefits and alternatives for the proposed anesthesia with the patient or authorized representative who has indicated his/her understanding and acceptance.     Dental advisory given  Plan Discussed with: CRNA and Anesthesiologist  Anesthesia Plan Comments:        Anesthesia Quick Evaluation

## 2021-03-09 NOTE — H&P (Signed)
  Tracy, Crawford is a 20 yo who was referred by general dentist for evaluation of third molars.    CC: Pain lower right wisdom tooth.  PMH: Asthma, Snoring, Morbid Obesity, HTN  Medications: Albuterol, Breztri inhaler, Singulair, Flonase   Allergies: Shellfish, Cephelexin, Augmentin  Surgeries: no surgeries  Social:   Tobacco: n  Alcohol: n  Drug use: n  Exam: BMI 42. Impacted teeth # 1, 16, 17, 32. No purulence, edema, fluctuance. Class 1 occlusion. Mallampati 1. Oral cancer screening negative. Pharynx clear. no lymphadenopathy   Pan: Impacted teeth 1, 16, 17, 32.   Assessment: ASA  3. Patient with  Impacted teeth 1, 16, 17, 32, no pericoronitis .    Plan: Extraction teeth 1, 16, 17, and 32, IV sedation. Risks and complications discussed.   Consent reviewed. All questions answered. Pre-op instructions given.     Rx: None  Georgia Lopes, DMD

## 2021-03-10 ENCOUNTER — Ambulatory Visit (HOSPITAL_BASED_OUTPATIENT_CLINIC_OR_DEPARTMENT_OTHER)
Admission: RE | Admit: 2021-03-10 | Discharge: 2021-03-10 | Disposition: A | Discharge status: Home / Self Care | Payer: Medicaid Other | Attending: Oral Surgery | Admitting: Oral Surgery

## 2021-03-10 ENCOUNTER — Other Ambulatory Visit: Payer: Self-pay

## 2021-03-10 ENCOUNTER — Ambulatory Visit (HOSPITAL_BASED_OUTPATIENT_CLINIC_OR_DEPARTMENT_OTHER): Payer: Medicaid Other | Admitting: Anesthesiology

## 2021-03-10 ENCOUNTER — Encounter (HOSPITAL_BASED_OUTPATIENT_CLINIC_OR_DEPARTMENT_OTHER)
Admission: RE | Disposition: A | Discharge status: Home / Self Care | Payer: Self-pay | Source: Home / Self Care | Attending: Oral Surgery

## 2021-03-10 DIAGNOSIS — J45909 Unspecified asthma, uncomplicated: Secondary | ICD-10-CM | POA: Insufficient documentation

## 2021-03-10 DIAGNOSIS — Z6841 Body Mass Index (BMI) 40.0 and over, adult: Secondary | ICD-10-CM | POA: Insufficient documentation

## 2021-03-10 DIAGNOSIS — K011 Impacted teeth: Secondary | ICD-10-CM | POA: Insufficient documentation

## 2021-03-10 HISTORY — PX: TOOTH EXTRACTION: SHX859

## 2021-03-10 LAB — POCT PREGNANCY, URINE: Preg Test, Ur: NEGATIVE

## 2021-03-10 SURGERY — DENTAL RESTORATION/EXTRACTIONS
Anesthesia: General | Site: Mouth | Laterality: Bilateral

## 2021-03-10 MED ORDER — DEXAMETHASONE SODIUM PHOSPHATE 10 MG/ML IJ SOLN
INTRAMUSCULAR | Status: DC | PRN
Start: 2021-03-10 — End: 2021-03-10
  Administered 2021-03-10: 10 mg via INTRAVENOUS

## 2021-03-10 MED ORDER — PROPOFOL 10 MG/ML IV BOLUS
INTRAVENOUS | Status: DC | PRN
  Administered 2021-03-10: 140 mg via INTRAVENOUS

## 2021-03-10 MED ORDER — AMISULPRIDE (ANTIEMETIC) 5 MG/2ML IV SOLN: 10.0000 mg | Freq: Once | INTRAVENOUS | Status: DC | PRN

## 2021-03-10 MED ORDER — LIDOCAINE HCL (CARDIAC) PF 100 MG/5ML IV SOSY
PREFILLED_SYRINGE | INTRAVENOUS | Status: DC | PRN
  Administered 2021-03-10: 100 mg via INTRAVENOUS

## 2021-03-10 MED ORDER — CLINDAMYCIN PHOSPHATE 600 MG/50ML IV SOLN
INTRAVENOUS | Status: AC
  Filled 2021-03-10: qty 50

## 2021-03-10 MED ORDER — FENTANYL CITRATE (PF) 100 MCG/2ML IJ SOLN
INTRAMUSCULAR | Status: DC | PRN
  Administered 2021-03-10: 100 ug via INTRAVENOUS

## 2021-03-10 MED ORDER — ROCURONIUM BROMIDE 100 MG/10ML IV SOLN
INTRAVENOUS | Status: DC | PRN
  Administered 2021-03-10: 60 mg via INTRAVENOUS

## 2021-03-10 MED ORDER — DEXMEDETOMIDINE (PRECEDEX) IN NS 20 MCG/5ML (4 MCG/ML) IV SYRINGE
PREFILLED_SYRINGE | INTRAVENOUS | Status: DC | PRN
  Administered 2021-03-10: 16 ug via INTRAVENOUS
  Administered 2021-03-10: 4 ug via INTRAVENOUS

## 2021-03-10 MED ORDER — FENTANYL CITRATE (PF) 100 MCG/2ML IJ SOLN
INTRAMUSCULAR | Status: AC
  Filled 2021-03-10: qty 2

## 2021-03-10 MED ORDER — MIDAZOLAM HCL 2 MG/2ML IJ SOLN
INTRAMUSCULAR | Status: DC | PRN
  Administered 2021-03-10: 2 mg via INTRAVENOUS

## 2021-03-10 MED ORDER — PROPOFOL 10 MG/ML IV BOLUS
INTRAVENOUS | Status: AC
  Filled 2021-03-10: qty 20

## 2021-03-10 MED ORDER — ONDANSETRON HCL 4 MG/2ML IJ SOLN
INTRAMUSCULAR | Status: AC
  Filled 2021-03-10: qty 2

## 2021-03-10 MED ORDER — LACTATED RINGERS IV SOLN: INTRAVENOUS | Status: DC

## 2021-03-10 MED ORDER — OXYCODONE HCL 5 MG PO TABS
5.0000 mg | ORAL_TABLET | Freq: Once | ORAL | Status: DC | PRN
Start: 2021-03-10 — End: 2021-03-10

## 2021-03-10 MED ORDER — ACETAMINOPHEN 10 MG/ML IV SOLN: 1000.0000 mg | Freq: Once | INTRAVENOUS | Status: DC | PRN

## 2021-03-10 MED ORDER — ONDANSETRON HCL 4 MG/2ML IJ SOLN
INTRAMUSCULAR | Status: DC | PRN
  Administered 2021-03-10: 4 mg via INTRAVENOUS

## 2021-03-10 MED ORDER — ONDANSETRON HCL 4 MG/2ML IJ SOLN
4.0000 mg | Freq: Once | INTRAMUSCULAR | Status: DC | PRN
Start: 2021-03-10 — End: 2021-03-10

## 2021-03-10 MED ORDER — MIDAZOLAM HCL 2 MG/2ML IJ SOLN
INTRAMUSCULAR | Status: AC
  Filled 2021-03-10: qty 2

## 2021-03-10 MED ORDER — HYDROMORPHONE HCL 1 MG/ML IJ SOLN: 0.2500 mg | INTRAMUSCULAR | Status: DC | PRN

## 2021-03-10 MED ORDER — CLINDAMYCIN PHOSPHATE 600 MG/50ML IV SOLN
600.0000 mg | INTRAVENOUS | Status: AC
  Administered 2021-03-10: 600 mg via INTRAVENOUS

## 2021-03-10 MED ORDER — SODIUM CHLORIDE 0.9 % IV SOLN
INTRAVENOUS | Status: AC | PRN
  Administered 2021-03-10: 100 mL via INTRAMUSCULAR

## 2021-03-10 MED ORDER — CLINDAMYCIN HCL 300 MG PO CAPS: 300.0000 mg | ORAL_CAPSULE | Freq: Three times a day (TID) | ORAL | 0 refills | Status: DC

## 2021-03-10 MED ORDER — OXYMETAZOLINE HCL 0.05 % NA SOLN
NASAL | Status: DC | PRN
  Administered 2021-03-10: 1 via NASAL

## 2021-03-10 MED ORDER — HYDROCODONE-ACETAMINOPHEN 5-325 MG PO TABS
1.0000 | ORAL_TABLET | Freq: Four times a day (QID) | ORAL | 0 refills | Status: DC | PRN
Start: 2021-03-10 — End: 2021-08-24

## 2021-03-10 MED ORDER — LIDOCAINE-EPINEPHRINE 2 %-1:100000 IJ SOLN
INTRAMUSCULAR | Status: DC | PRN
  Administered 2021-03-10: 20 mL via INTRADERMAL

## 2021-03-10 MED ORDER — SUGAMMADEX SODIUM 500 MG/5ML IV SOLN
INTRAVENOUS | Status: DC | PRN
  Administered 2021-03-10: 500 mg via INTRAVENOUS

## 2021-03-10 MED ORDER — OXYCODONE HCL 5 MG/5ML PO SOLN: 5.0000 mg | Freq: Once | ORAL | Status: DC | PRN

## 2021-03-10 SURGICAL SUPPLY — 29 items
BLADE SURG 15 STRL LF DISP TIS (BLADE) ×1 IMPLANT
BLADE SURG 15 STRL SS (BLADE) ×2
BUR CROSS CUT FISSURE 1.6 (BURR) ×2 IMPLANT
CANISTER SUCT 1200ML W/VALVE (MISCELLANEOUS) ×2 IMPLANT
COVER BACK TABLE 60X90IN (DRAPES) ×2 IMPLANT
COVER MAYO STAND STRL (DRAPES) ×2 IMPLANT
DRAPE U-SHAPE 76X120 STRL (DRAPES) ×2 IMPLANT
GLOVE SURG ENC MOIS LTX SZ6.5 (GLOVE) ×2 IMPLANT
GLOVE SURG ENC MOIS LTX SZ8 (GLOVE) ×2 IMPLANT
GLOVE SURG UNDER POLY LF SZ6.5 (GLOVE) ×2 IMPLANT
GOWN STRL REUS W/ TWL LRG LVL3 (GOWN DISPOSABLE) ×1 IMPLANT
GOWN STRL REUS W/ TWL XL LVL3 (GOWN DISPOSABLE) ×1 IMPLANT
GOWN STRL REUS W/TWL LRG LVL3 (GOWN DISPOSABLE) ×2
GOWN STRL REUS W/TWL XL LVL3 (GOWN DISPOSABLE) ×2
IV NS 500ML (IV SOLUTION) ×2
IV NS 500ML BAXH (IV SOLUTION) ×1 IMPLANT
NEEDLE HYPO 22GX1.5 SAFETY (NEEDLE) ×2 IMPLANT
NS IRRIG 1000ML POUR BTL (IV SOLUTION) ×2 IMPLANT
PACK BASIN DAY SURGERY FS (CUSTOM PROCEDURE TRAY) ×2 IMPLANT
SLEEVE IRRIGATION ELITE 7 (MISCELLANEOUS) ×2 IMPLANT
SLEEVE SCD COMPRESS KNEE MED (STOCKING) ×1 IMPLANT
SPONGE T-LAP 4X18 ~~LOC~~+RFID (SPONGE) ×2 IMPLANT
SUT CHROMIC 3 0 PS 2 (SUTURE) ×2 IMPLANT
SYR BULB EAR ULCER 3OZ GRN STR (SYRINGE) ×2 IMPLANT
SYR CONTROL 10ML LL (SYRINGE) ×2 IMPLANT
TOWEL GREEN STERILE FF (TOWEL DISPOSABLE) ×2 IMPLANT
TUBE CONNECTING 20X1/4 (TUBING) ×2 IMPLANT
TUBING IRRIGATION (MISCELLANEOUS) ×2 IMPLANT
YANKAUER SUCT BULB TIP NO VENT (SUCTIONS) ×2 IMPLANT

## 2021-03-10 NOTE — Op Note (Signed)
Tracy Crawford, TOUCH MEDICAL RECORD NO: [id number] ACCOUNT NO: [id number] DATE OF BIRTH: [birth year] FACILITY: [facility] LOCATION: [facility] PHYSICIAN: Georgia Lopes, DDS  Operative Report   DATE OF PROCEDURE: 03/10/2021  PREOPERATIVE DIAGNOSES:  Impacted teeth 1, 16, 17 and 32. Morbid obesity.  POSTOPERATIVE DIAGNOSES:  Impacted teeth 1, 16, 17 and 32. Morbid obesity.  PROCEDURE:  Extraction of teeth numbers 1, 16, 17 and 32.  SURGEON:  Georgia Lopes, DDS.  ANESTHESIA:  General nasal intubation.  DESCRIPTION OF PROCEDURE:  The patient was taken to the operating room and placed on the table in supine position.  General anesthesia was administered.  A nasal endotracheal tube was placed and secured.  The eyes were protected.  The patient was draped  for surgery.  Timeout was performed.  The posterior pharynx was suctioned and a throat pack was placed.  2% lidocaine 1:100,000 epinephrine was infiltrated in an inferior alveolar block on the right and left sides and buccal and palatal infiltration in  the right and left maxilla.  A bite block was placed on the right side of the mouth.  A sweetheart retractor was used to retract the tongue.  A #15 blade was used to make an incision overlying tooth #17, carried forward to the gingival sulcus alongside  tooth #18.  Another incision was created overlying tooth #16 and carried forward in the buccal sulcus at tooth #15. The periosteum was reflected from around these teeth.  Bone was removed overlying tooth #17.  The tooth was sectioned into multiple pieces  and removed with a 301 elevator.  The socket was curetted, irrigated and closed with 3-0 chromic.  Then, the periosteum was reflected overlying tooth #16.  Bone was removed using the 301 elevator and then the tooth was elevated and removed with a Financial risk analyst.  The socket was then curetted, irrigated and closed with 3-0 chromic.  The bite block and sweetheart retractor were repositioned to the  other side of the mouth.  A 15 blade was used to make an incision overlying teeth numbers 1 and 32 and then  the periosteum was reflected.  Bone was removed overlying tooth #32.  The tooth was sectioned into multiple pieces and removed.  The socket was curetted, irrigated and closed with 3-0 chromic.  Then, the periosteum was reflected, overlying tooth #1. Bone  was removed using the [employer] handpiece under irrigation.  Then, the tooth was elevated and removed with the 301 elevator.  The socket was curetted, irrigated and closed with 3-0 chromic.  Additional local anesthesia was administered.  The oral cavity  was then irrigated and suctioned.  The throat pack was removed.  The patient was left under the care of anesthesia for extubation and transport to recovery room with plans for discharge home through day surgery.  ESTIMATED BLOOD LOSS:  Minimum.  COMPLICATIONS: None.  SPECIMENS:  None.   PAA D: 03/10/2021 12:49:35 pm T: 03/10/2021 11:46:00 pm  JOB: [id number]/ [id number]

## 2021-03-10 NOTE — Anesthesia Procedure Notes (Signed)
Procedure Name: Intubation Date/Time: 03/10/2021 12:18 PM Performed by: Lavonia Dana, CRNA Pre-anesthesia Checklist: Patient identified, Emergency Drugs available, Suction available and Patient being monitored Patient Re-evaluated:Patient Re-evaluated prior to induction Oxygen Delivery Method: Circle system utilized Preoxygenation: Pre-oxygenation with 100% oxygen Induction Type: IV induction Ventilation: Mask ventilation without difficulty Laryngoscope Size: Mac and 3 Grade View: Grade I Nasal Tubes: Nasal prep performed, Nasal Rae and Right Tube size: 6.5 mm Placement Confirmation: ETT inserted through vocal cords under direct vision, positive ETCO2 and breath sounds checked- equal and bilateral Secured at: 25 (At right nare) cm Tube secured with: Tape Dental Injury: Teeth and Oropharynx as per pre-operative assessment

## 2021-03-10 NOTE — Anesthesia Postprocedure Evaluation (Signed)
Anesthesia Post Note  Patient: Tracy Crawford  Procedure(s) Performed: DENTAL RESTORATION/EXTRACTIONS (Bilateral: Mouth)     Patient location during evaluation: PACU Anesthesia Type: General Level of consciousness: awake and alert Pain management: pain level controlled Vital Signs Assessment: post-procedure vital signs reviewed and stable Respiratory status: spontaneous breathing, nonlabored ventilation, respiratory function stable and patient connected to nasal cannula oxygen Cardiovascular status: blood pressure returned to baseline and stable Postop Assessment: no apparent nausea or vomiting Anesthetic complications: no   No notable events documented.  Last Vitals:  Vitals:   03/10/21 1330 03/10/21 1345  BP: 121/77 (!) 141/87  Pulse: 73 63  Resp: 14 14  Temp:  36.8 C  SpO2: 97% 97%    Last Pain:  Vitals:   03/10/21 1345  TempSrc:   PainSc: 2                  Trevor Iha

## 2021-03-10 NOTE — H&P (Signed)
H&P documentation  -History and Physical Reviewed  -Patient has been re-examined  -No change in the plan of care  Tracy Crawford  

## 2021-03-10 NOTE — Discharge Instructions (Signed)

## 2021-03-10 NOTE — Transfer of Care (Signed)
Immediate Anesthesia Transfer of Care Note  Patient: Tracy Crawford  Procedure(s) Performed: DENTAL RESTORATION/EXTRACTIONS (Bilateral: Mouth)  Patient Location: PACU  Anesthesia Type:General  Level of Consciousness: awake, alert  and oriented  Airway & Oxygen Therapy: Patient Spontanous Breathing and Patient connected to face mask oxygen  Post-op Assessment: Report given to RN and Post -op Vital signs reviewed and stable  Post vital signs: Reviewed and stable  Last Vitals:  Vitals Value Taken Time  BP 132/86 03/10/21 1303  Temp    Pulse 77 03/10/21 1307  Resp 17 03/10/21 1307  SpO2 100 % 03/10/21 1307  Vitals shown include unvalidated device data.  Last Pain:  Vitals:   03/10/21 1028  TempSrc: Oral  PainSc: 0-No pain         Complications: No notable events documented.

## 2021-03-10 NOTE — Op Note (Signed)
03/10/2021  12:46 PM  PATIENT:  Tracy Crawford  20 y.o. female  PRE-OPERATIVE DIAGNOSIS:  IMPACTED TEETH # 1, 16, 17, 32, MORBID OBESITY  POST-OPERATIVE DIAGNOSIS:  SAME  PROCEDURE:  Procedure(s): EXTRACTION TEETH # 1, 16, 17, 32,  SURGEON:  Surgeon(s): Barbette Merino, Acupuncturist, DMD  ANESTHESIA:   local and general  EBL:  minimal  DRAINS: none   SPECIMEN:  No Specimen  COUNTS:  YES  PLAN OF CARE: Discharge to home after PACU  PATIENT DISPOSITION:  PACU - hemodynamically stable.   PROCEDURE DETAILS: Dictation #[id number]  Georgia Lopes, DMD 03/10/2021 12:46 PM

## 2021-03-12 ENCOUNTER — Encounter (HOSPITAL_BASED_OUTPATIENT_CLINIC_OR_DEPARTMENT_OTHER): Payer: Self-pay | Admitting: Oral Surgery

## 2021-04-13 ENCOUNTER — Telehealth: Payer: Self-pay | Admitting: Allergy

## 2021-04-13 DIAGNOSIS — J309 Allergic rhinitis, unspecified: Secondary | ICD-10-CM

## 2021-04-13 NOTE — Telephone Encounter (Signed)
Patient is in need of inhaler. Patient's mother called stating they stopped by pharmacy and were told proair has been discontinued. Patient has breztri on file but is in need of a prior authorization. Mother states patient does not have any medication now and needs this resolved today.   Mom provided her cell phone number: [phone] and stated she would be next to patient at all times.   I did ask mom for patients number: [phone]

## 2021-04-13 NOTE — Telephone Encounter (Signed)
Spoke with patient, informed her that unfortunately we are unable to send in refills at this time. I informed patient that the doctor wanted to see you back in 3 months and that per [facility] policy asthma patients are to be seen every 6 months. Patient started to raise her voice about needing her inhaler. I informed patient that she spoke with a nurse in October who informed her that she will need an office visit for further refills. Patient asked if we had any openings tomorrow. After speaking with Albin Felling there were some cancellations and she was able to schedule her for 4:15 pm with Dr. Dellis Anes. Patient was informed that refills would be sent in at the time of her visit and a prior authorization would be done after patient's visit.

## 2021-04-14 ENCOUNTER — Ambulatory Visit: Payer: No Typology Code available for payment source | Admitting: Allergy & Immunology

## 2021-04-14 ENCOUNTER — Other Ambulatory Visit: Payer: Self-pay | Admitting: *Deleted

## 2021-04-14 MED ORDER — VENTOLIN HFA 108 (90 BASE) MCG/ACT IN AERS: 2.0000 | INHALATION_SPRAY | RESPIRATORY_TRACT | 1 refills | Status: DC | PRN

## 2021-04-14 NOTE — Addendum Note (Signed)
Addended by: Dub Mikes on: 04/14/2021 04:46 PM   Modules accepted: Orders

## 2021-04-14 NOTE — Telephone Encounter (Signed)
Patient no showed to her appointment today 04/14/2021. Patient will need an office visit before any medication is sent in.

## 2021-04-14 NOTE — Telephone Encounter (Signed)
Patient is due for an office visit. Patient called yesterday 04/13/2021 and was informed she is over due for an office visit and would need to be seen prior to refills being sent in. Patient made an appointment for today 04/14/2021 which she no showed to. Patient will need an office visit prior to refills being sent in. Patient has been made aware.

## 2021-04-17 ENCOUNTER — Ambulatory Visit: Payer: No Typology Code available for payment source | Admitting: Allergy

## 2021-04-17 DIAGNOSIS — J309 Allergic rhinitis, unspecified: Secondary | ICD-10-CM

## 2021-04-27 IMAGING — US US PELVIS COMPLETE WITH TRANSVAGINAL
1 series · 14 of 25 positions shown · non-contrast
Comparison: Prior gestational ultrasounds 02/08/2018, CT abdomen
pelvis 10/28/2018

CLINICAL DATA: Pelvic pain



[Series 1: us pelvis complete with transvaginal · 14 of 47 slices shown]
[im 1/47]
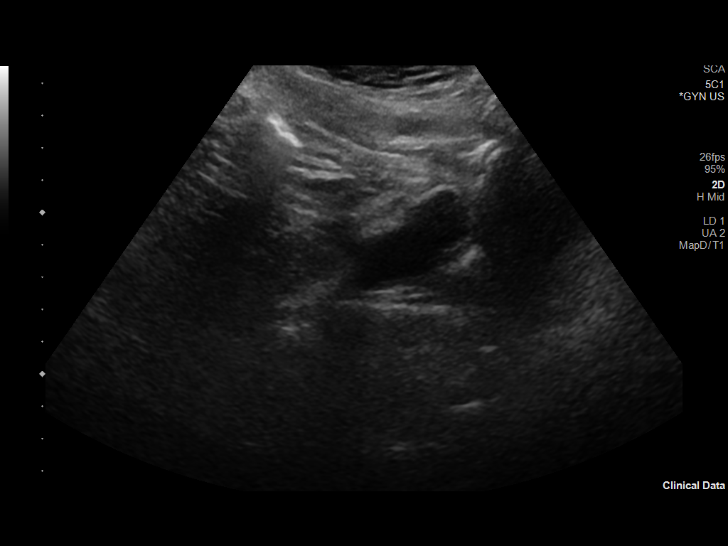
[im 4/47]
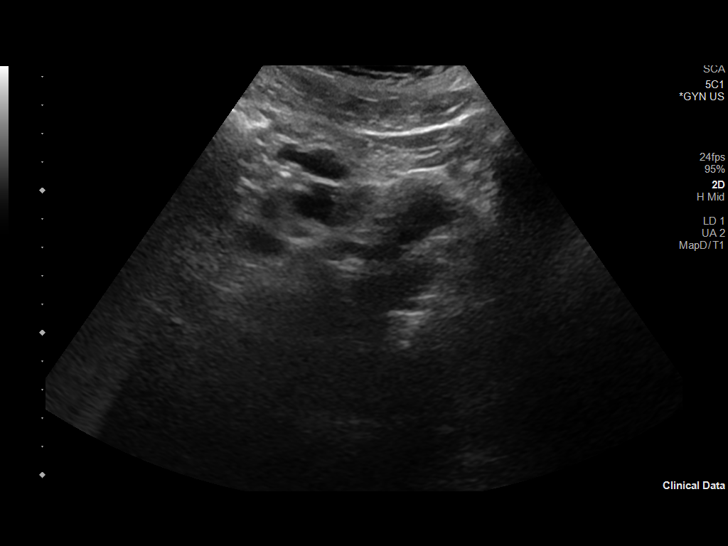
[im 8/47]
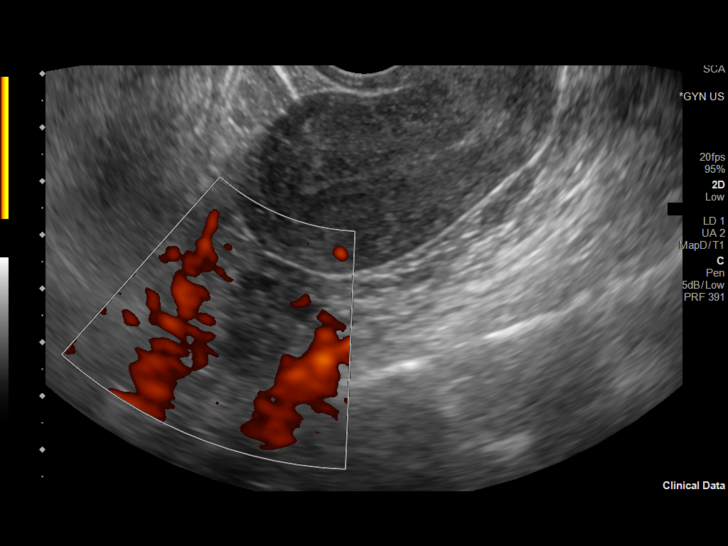
[im 12/47]
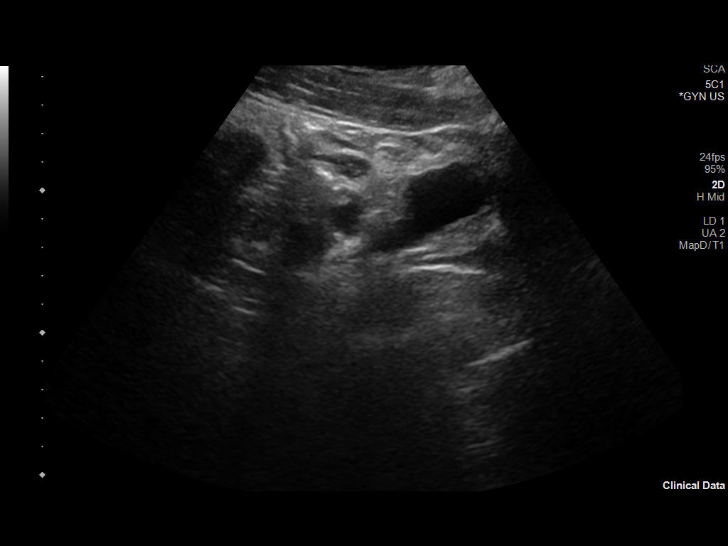
[im 16/47]
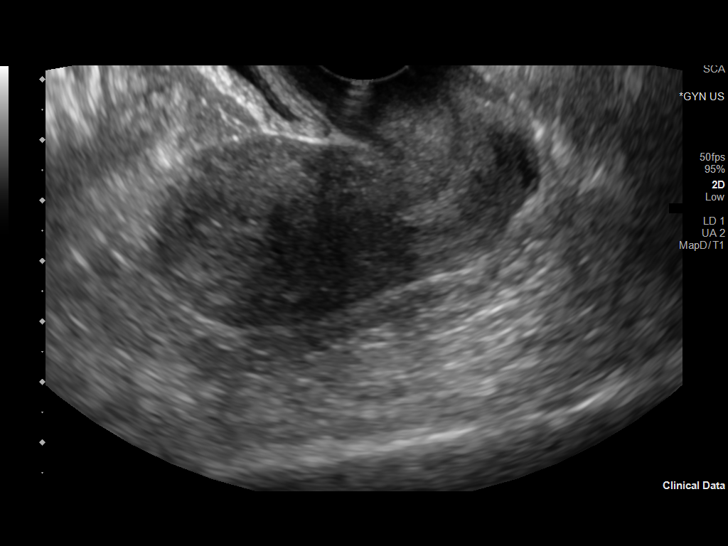
[im 18/47]
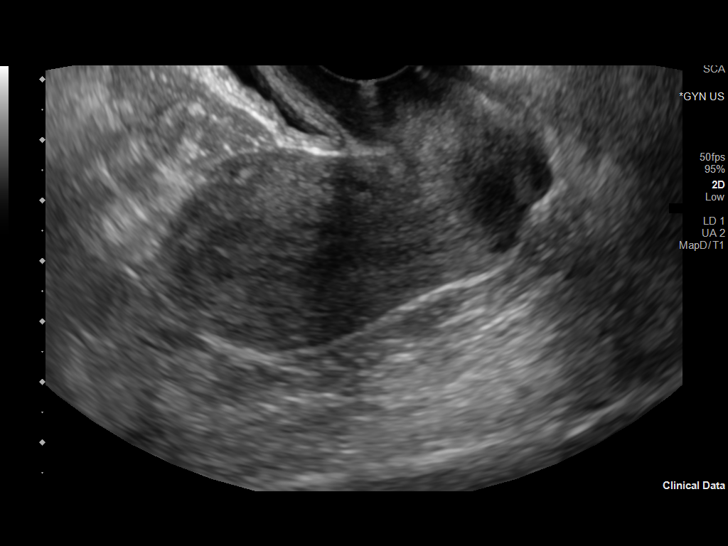
[im 22/47]
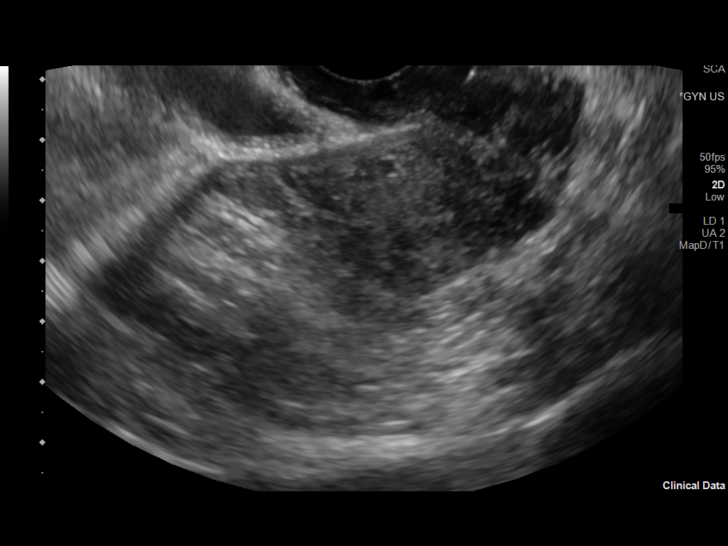
[im 25/47]
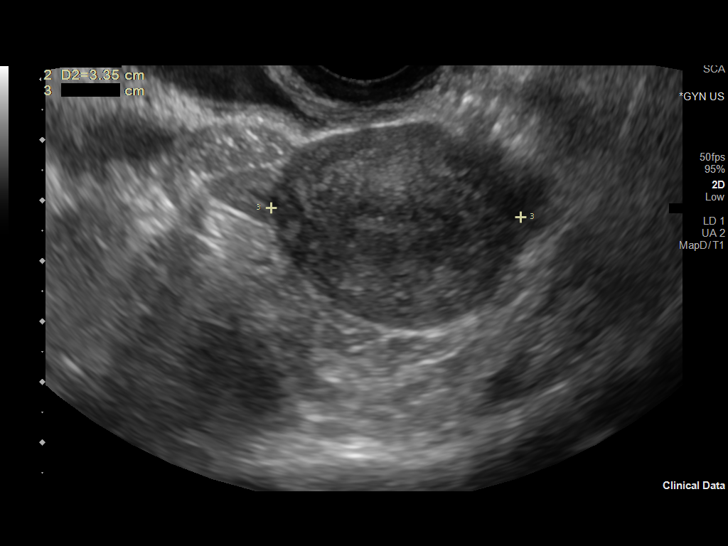
[im 29/47]
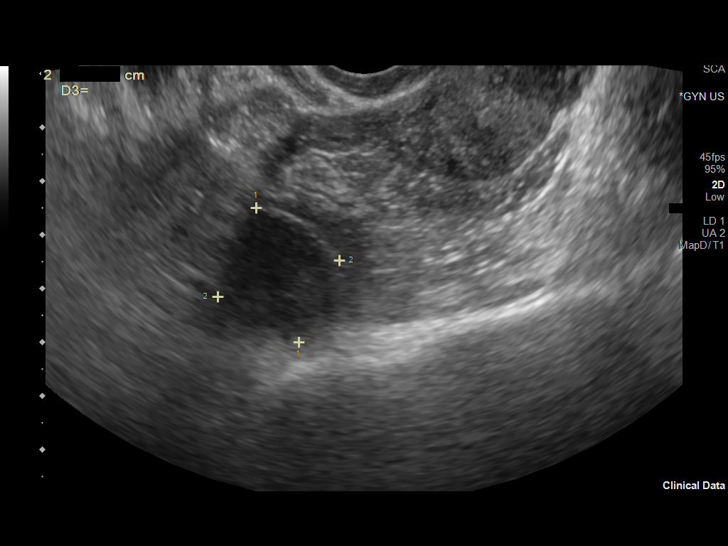
[im 31/47]
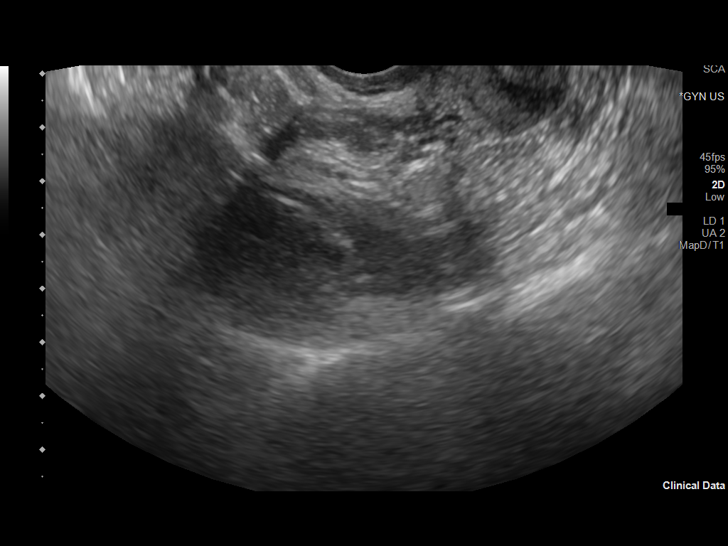
[im 35/47]
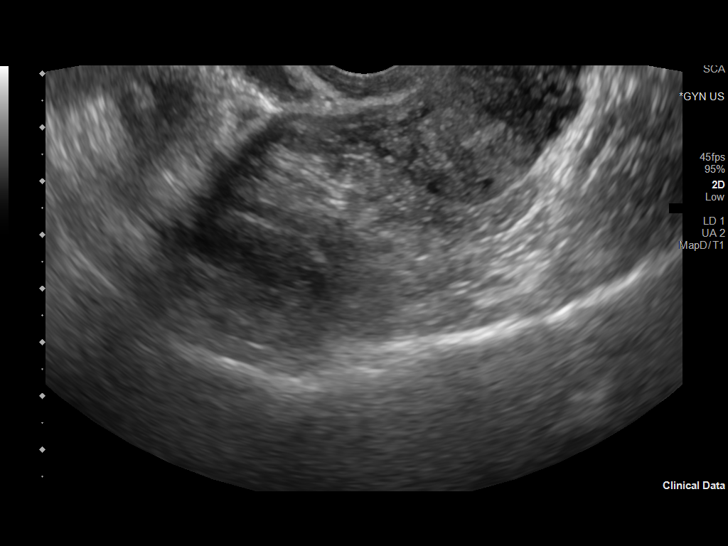
[im 39/47]
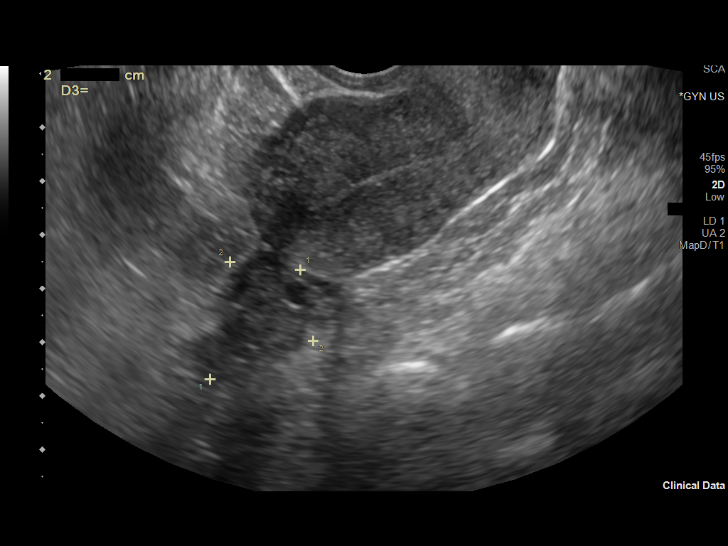
[im 43/47]
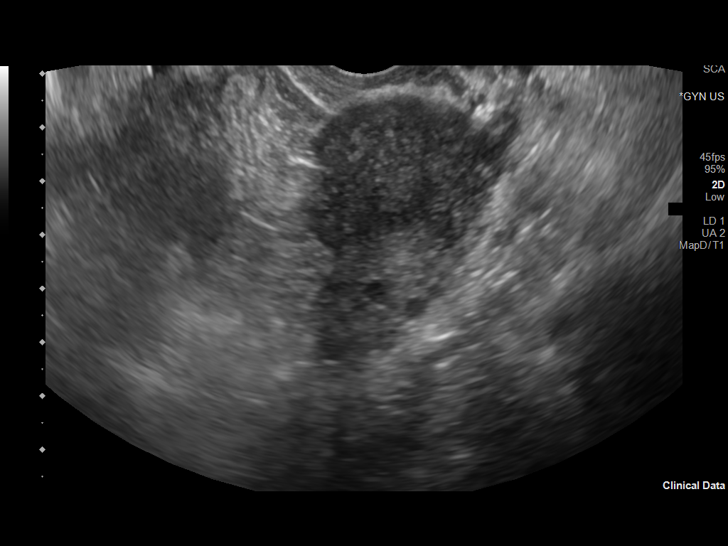
[im 47/47]
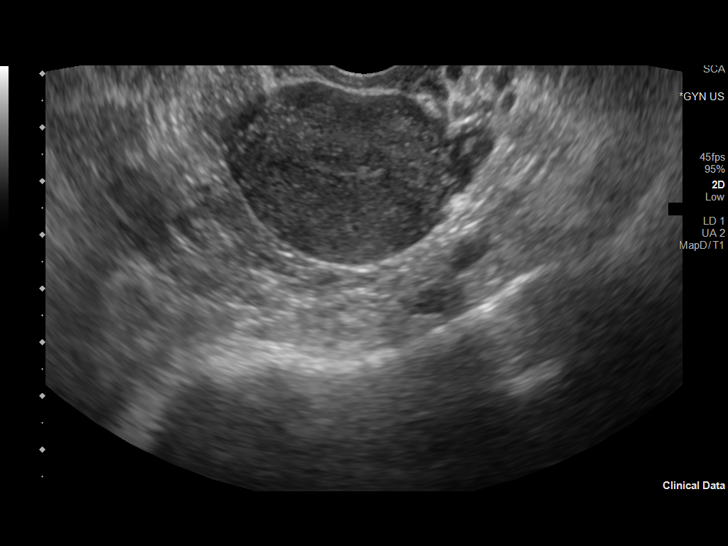

[14 of 25 positions shown; findings below may reference images not displayed]

FINDINGS: Uterus

Measurements: 6.6 x 3.4 x 4.1 cm = volume: 47.7 mL. Normal
anteverted uterus. No concerning uterine mass or visible fibroid.

Endometrium

Thickness: 2.5 mm, non thickened.  No focal abnormality visualized.

Right ovary

Measurements: 2.6 x 2.4 x 3.1 cm = volume: 10.1 mL. Normal
appearance/no adnexal mass.

Left ovary

Measurements: 2.6 x 2.1 x 2.1 cm = volume: 6.2 mL. Normal
appearance/no adnexal mass.

Other findings

No abnormal free fluid.
IMPRESSION: Unremarkable pelvic ultrasound

## 2021-06-07 NOTE — Patient Instructions (Incomplete)
Asthma  Continue montelukast 10 mg once a day to prevent cough or wheeze Continue Breztri- 2 puffs twice a day with a spacer to prevent cough or wheeze Continue albuterol 2 puffs every 4 hours as needed for cough or wheeze OR Instead use albuterol 0.083% solution via nebulizer one unit vial every 4 hours as needed for cough or wheeze  Allergic rhinitis Continue cetirizine 10mg  daily use (may take additional tab if needed in a day) Continue Flonase 2 sprays in each nostril once a day for 1-2 weeks at a time before stopping once nasal congestion symptoms improve.   Consider saline nasal rinses as needed for nasal symptoms. Use this before any medicated nasal sprays for best result  Allergic conjunctivitis Continue Pataday one drop in each eye once a day as needed for red, itchy eyes.   Food allergy Continue to avoid shellfish and all nuts. In case of an allergic reaction, take Benadryl 50 mg every 4 hours, and if life-threatening symptoms occur, inject with EpiPen 0.3 mg. Consider retesting to shellfish and tree nuts if you are interested.  Oral allergy syndrome Continue to avoid fruits that bother your mouth. In case of an allergic reaction, take Benadryl 50 mg every 4 hours, and if life-threatening symptoms occur, inject with EpiPen 0.3 mg.  Call the clinic if this treatment plan is not working well for you  Follow up in  months or sooner if needed.

## 2021-06-08 ENCOUNTER — Ambulatory Visit: Payer: No Typology Code available for payment source | Admitting: Family

## 2021-06-09 ENCOUNTER — Ambulatory Visit: Payer: No Typology Code available for payment source | Admitting: Allergy & Immunology

## 2021-06-09 DIAGNOSIS — J309 Allergic rhinitis, unspecified: Secondary | ICD-10-CM

## 2021-08-24 ENCOUNTER — Ambulatory Visit (INDEPENDENT_AMBULATORY_CARE_PROVIDER_SITE_OTHER): Payer: Medicaid Other | Admitting: Allergy

## 2021-08-24 ENCOUNTER — Other Ambulatory Visit: Payer: Self-pay

## 2021-08-24 ENCOUNTER — Encounter: Payer: Self-pay | Admitting: Allergy

## 2021-08-24 VITALS — BP 118/64 | HR 100 | Temp 98.1°F | Resp 18 | Ht 63.0 in | Wt 191.2 lb

## 2021-08-24 DIAGNOSIS — J4551 Severe persistent asthma with (acute) exacerbation: Secondary | ICD-10-CM | POA: Insufficient documentation

## 2021-08-24 DIAGNOSIS — J3089 Other allergic rhinitis: Secondary | ICD-10-CM | POA: Diagnosis not present

## 2021-08-24 DIAGNOSIS — Z91199 Patient's noncompliance with other medical treatment and regimen due to unspecified reason: Secondary | ICD-10-CM

## 2021-08-24 DIAGNOSIS — Z7722 Contact with and (suspected) exposure to environmental tobacco smoke (acute) (chronic): Secondary | ICD-10-CM | POA: Diagnosis not present

## 2021-08-24 DIAGNOSIS — T7800XD Anaphylactic reaction due to unspecified food, subsequent encounter: Secondary | ICD-10-CM

## 2021-08-24 DIAGNOSIS — H1013 Acute atopic conjunctivitis, bilateral: Secondary | ICD-10-CM

## 2021-08-24 DIAGNOSIS — T781XXD Other adverse food reactions, not elsewhere classified, subsequent encounter: Secondary | ICD-10-CM | POA: Insufficient documentation

## 2021-08-24 MED ORDER — OLOPATADINE HCL 0.2 % OP SOLN: 1.0000 [drp] | Freq: Every day | OPHTHALMIC | 5 refills | Status: DC | PRN

## 2021-08-24 MED ORDER — ALBUTEROL SULFATE HFA 108 (90 BASE) MCG/ACT IN AERS: 2.0000 | INHALATION_SPRAY | RESPIRATORY_TRACT | 1 refills | Status: DC | PRN

## 2021-08-24 MED ORDER — LEVOCETIRIZINE DIHYDROCHLORIDE 5 MG PO TABS: 5.0000 mg | ORAL_TABLET | Freq: Every evening | ORAL | 5 refills | Status: DC

## 2021-08-24 MED ORDER — FLUTICASONE PROPIONATE 50 MCG/ACT NA SUSP: 1.0000 | Freq: Two times a day (BID) | NASAL | 5 refills | Status: DC | PRN

## 2021-08-24 MED ORDER — METHYLPREDNISOLONE ACETATE 80 MG/ML IJ SUSP
80.0000 mg | Freq: Once | INTRAMUSCULAR | Status: AC
  Administered 2021-08-24: 80 mg via INTRAMUSCULAR

## 2021-08-24 MED ORDER — EPINEPHRINE 0.3 MG/0.3ML IJ SOAJ: 0.3000 mg | INTRAMUSCULAR | 2 refills | Status: DC | PRN

## 2021-08-24 MED ORDER — BUDESONIDE 0.5 MG/2ML IN SUSP: 0.5000 mg | Freq: Two times a day (BID) | RESPIRATORY_TRACT | 2 refills | Status: DC

## 2021-08-24 MED ORDER — TRELEGY ELLIPTA 200-62.5-25 MCG/ACT IN AEPB: 1.0000 | INHALATION_SPRAY | Freq: Every day | RESPIRATORY_TRACT | 3 refills | Status: DC

## 2021-08-24 MED ORDER — MONTELUKAST SODIUM 10 MG PO TABS: 10.0000 mg | ORAL_TABLET | Freq: Every day | ORAL | 5 refills | Status: DC

## 2021-08-24 MED ORDER — IPRATROPIUM-ALBUTEROL 0.5-2.5 (3) MG/3ML IN SOLN
3.0000 mL | Freq: Once | RESPIRATORY_TRACT | Status: AC
  Administered 2021-08-24: 3 mL via RESPIRATORY_TRACT

## 2021-08-24 MED ORDER — ALBUTEROL SULFATE (2.5 MG/3ML) 0.083% IN NEBU: 2.5000 mg | INHALATION_SOLUTION | RESPIRATORY_TRACT | 1 refills | Status: DC | PRN

## 2021-08-24 NOTE — Assessment & Plan Note (Signed)
?   Continue to avoid fresh fruits that are bothersome.  

## 2021-08-24 NOTE — Assessment & Plan Note (Signed)
No recent testing. Takes Flonase and pataday prn. ?? Start Xyzal (levocetirizine) 5mg  daily. ?? Use Flonase (fluticasone) nasal spray 1 spray per nostril twice a day as needed for nasal congestion.  ?? Nasal saline spray (i.e., Simply Saline) or nasal saline lavage (i.e., NeilMed) is recommended as needed and prior to medicated nasal sprays. ?? Use olopatadine eye drops 0.2% once a day as needed for itchy/watery eyes. ?? Get bloodwork. ?

## 2021-08-24 NOTE — Assessment & Plan Note (Signed)
Ran out of maintenance inhaler Breztri. Takes singulair and albuterol multiple times a day. No recent prednisone use. Called 911 twice last night for nebulizer treatment. ?? Today's spirometry showed severe mixed obstruction and restriction with 87% improvement in FEV1 post bronchodilator treatment. Clinically feeling improved, wheezing resolved.  ?? Depo IM 80mg  given today.  ?? Start prednisone taper. ?? Stressed the importance of medication adherence and avoiding smoking including second hand smoking exposure.  ?? Get bloodwork to see if qualify for biologics for asthma.  ?? Daily controller medication(s): Start Trelegy 1 puff once a day and rinse mouth after each use. Samples given. Demonstrated proper use.  ?o Continue Singulair (montelukast) 10mg  daily at night. ?o Nebulizer machine given.  ?? During upper respiratory infections/flares:  ?o Start Pulmicort (budesonide) 0.5mg  nebulizer twice a day for 1-2 weeks until your breathing symptoms return to baseline.  ?o Pretreat with albuterol 2 puffs or albuterol nebulizer.  ?o If you need to use your albuterol nebulizer machine back to back within 15-30 minutes with no relief then please go to the ER/urgent care for further evaluation.  ?? May use albuterol rescue inhaler 2 puffs or nebulizer every 4 to 6 hours as needed for shortness of breath, chest tightness, coughing, and wheezing. May use albuterol rescue inhaler 2 puffs 5 to 15 minutes prior to strenuous physical activities. Monitor frequency of use.  ?? Get spirometry at next visit. ?

## 2021-08-24 NOTE — Patient Instructions (Addendum)
Asthma ?Steroid injection given today. ?Prednisone 10mg  tablet pack: ?2 tablets given in office today. Take 2 more tablets before bed today.  ?Then take 2 tablets twice a day for 2 more days. ?Then take 2 tablets once a day for 1 day. ?Then take 1 tablet once a day for 1 day.  ? ?Get bloodwork to see if you qualify for an injection for asthma - Nucala or Fasenra.  ? ?Daily controller medication(s): Start Trelegy 1 puff once a day and rinse mouth after each use. Samples given. Demonstrated proper use. ?Continue Singulair (montelukast) 10mg  daily at night. ?During upper respiratory infections/flares:  ?Start Pulmicort (budesonide) 0.5mg  nebulizer twice a day for 1-2 weeks until your breathing symptoms return to baseline.  ?Pretreat with albuterol 2 puffs or albuterol nebulizer.  ?If you need to use your albuterol nebulizer machine back to back within 15-30 minutes with no relief then please go to the ER/urgent care for further evaluation.  ?May use albuterol rescue inhaler 2 puffs or nebulizer every 4 to 6 hours as needed for shortness of breath, chest tightness, coughing, and wheezing. May use albuterol rescue inhaler 2 puffs 5 to 15 minutes prior to strenuous physical activities. Monitor frequency of use.  ?Asthma control goals:  ?Full participation in all desired activities (may need albuterol before activity) ?Albuterol use two times or less a week on average (not counting use with activity) ?Cough interfering with sleep two times or less a month ?Oral steroids no more than once a year ?No hospitalizations  ? ?Allergic rhino conjunctivitis ?Start Xyzal (levocetirizine) 5mg  daily. ?Use Flonase (fluticasone) nasal spray 1 spray per nostril twice a day as needed for nasal congestion.  ?Nasal saline spray (i.e., Simply Saline) or nasal saline lavage (i.e., NeilMed) is recommended as needed and prior to medicated nasal sprays. ?Use olopatadine eye drops 0.2% once a day as needed for itchy/watery eyes. ? ?Food  allergy ?Continue to avoid shellfish, mollusks, tree nuts. ?For mild symptoms you can take over the counter antihistamines such as Benadryl and monitor symptoms closely. If symptoms worsen or if you have severe symptoms including breathing issues, throat closure, significant swelling, whole body hives, severe diarrhea and vomiting, lightheadedness then inject epinephrine and seek immediate medical care afterwards. ?Action plan in place. ? ?Oral allergy syndrome. ?Continue to avoid fresh fruits that are bothersome. ? ?Follow up in 4 weeks or sooner if needed. ?

## 2021-08-24 NOTE — Progress Notes (Signed)
? ?Follow Up Note ? ?RE: Tracy Crawford MRN: [id number] DOB: 09-23-2000 ?Date of Office Visit: 08/24/2021 ? ?Referring provider: No ref. provider found ?Primary care provider: Pcp, No ? ?Chief Complaint: Asthma ? ?History of Present Illness: ?I had the pleasure of seeing Tracy Crawford for a follow up visit at the [facility] on 08/24/2021. She is a 21 y.o. female, who is being followed for asthma, allergic rhino conjunctivitis and food allergy. Her previous allergy office visit was on 07/04/2020 with Dr. Delorse Lek. Today is a new complaint visit of follow up . Failed to follow up as recommended.  ? ?Asthma ?Patient ran out of Markus Daft a few months ago which is flaring her asthma symptoms. But apparently her asthma was not well controlled while on Breztri either.  ?Complaining of wheezing, coughing, chest tightness, shortness of breath, nocturnal awakenings. ?Using albuterol more than twice a day now. ?No nebulizer machine at home.  ? ?Currently taking montelukast daily.  ? ?Patient called 911 last night twice due to her breathing issues. They had to give her albuterol nebulizer treatment. ?No recent prednisone use.  ? ?Denies smoking, vaping.  ?Denies fevers/chills, recent URIs. ?Had Covid-19 x 2 but does not believe in vaccinations and therefore did not get the Covid-19 vaccines.  ?  ?Allergic rhino conjunctivitis ?Currently having nasal congestion, itchy/watery eyes. ?Currently taking pataday prn, Flonase as needed. ?Not taking any antihistamines. ?  ?Food allergy ?Avoiding shellfish, tree nuts and certain fruits.  ?No reactions. ? ?Assessment and Plan: ?Javier is a 22 y.o. female with: ?Severe persistent asthma with (acute) exacerbation ?Ran out of maintenance inhaler Breztri. Takes singulair and albuterol multiple times a day. No recent prednisone use. Called 911 twice last night for nebulizer treatment. ?Today's spirometry showed severe mixed obstruction and restriction with 87% improvement in FEV1  post bronchodilator treatment. Clinically feeling improved, wheezing resolved.  ?Depo IM 80mg  given today.  ?Start prednisone taper. ?Stressed the importance of medication adherence and avoiding smoking including second hand smoking exposure.  ?Get bloodwork to see if qualify for biologics for asthma.  ?Daily controller medication(s): Start Trelegy 1 puff once a day and rinse mouth after each use. Samples given. Demonstrated proper use.  ?Continue Singulair (montelukast) 10mg  daily at night. ?Nebulizer machine given.  ?During upper respiratory infections/flares:  ?Start Pulmicort (budesonide) 0.5mg  nebulizer twice a day for 1-2 weeks until your breathing symptoms return to baseline.  ?Pretreat with albuterol 2 puffs or albuterol nebulizer.  ?If you need to use your albuterol nebulizer machine back to back within 15-30 minutes with no relief then please go to the ER/urgent care for further evaluation.  ?May use albuterol rescue inhaler 2 puffs or nebulizer every 4 to 6 hours as needed for shortness of breath, chest tightness, coughing, and wheezing. May use albuterol rescue inhaler 2 puffs 5 to 15 minutes prior to strenuous physical activities. Monitor frequency of use.  ?Get spirometry at next visit. ? ?Other allergic rhinitis ?No recent testing. Takes Flonase and pataday prn. ?Start Xyzal (levocetirizine) 5mg  daily. ?Use Flonase (fluticasone) nasal spray 1 spray per nostril twice a day as needed for nasal congestion.  ?Nasal saline spray (i.e., Simply Saline) or nasal saline lavage (i.e., NeilMed) is recommended as needed and prior to medicated nasal sprays. ?Use olopatadine eye drops 0.2% once a day as needed for itchy/watery eyes. ?Get bloodwork. ? ?Allergic conjunctivitis of both eyes ?See assessment and plan as above. ? ?Anaphylactic reaction due to food, subsequent encounter ?Past history -  2022 blood work positive shellfish, mollusks, peanuts, tree nuts.   ?Continue to avoid shellfish, mollusks, tree  nuts. ?For mild symptoms you can take over the counter antihistamines such as Benadryl and monitor symptoms closely. If symptoms worsen or if you have severe symptoms including breathing issues, throat closure, significant swelling, whole body hives, severe diarrhea and vomiting, lightheadedness then inject epinephrine and seek immediate medical care afterwards. ?Action plan in place. ? ?Oral allergy syndrome, subsequent encounter ?Continue to avoid fresh fruits that are bothersome. ? ?Return in about 4 weeks (around 09/21/2021). ? ?Meds ordered this encounter  ?Medications  ? Fluticasone-Umeclidin-Vilant (TRELEGY ELLIPTA) 200-62.5-25 MCG/ACT AEPB  ?  Sig: Inhale 1 puff into the lungs daily. Rinse mouth after each use.  ?  Dispense:  60 each  ?  Refill:  3  ? albuterol (VENTOLIN HFA) 108 (90 Base) MCG/ACT inhaler  ?  Sig: Inhale 2 puffs into the lungs every 4 (four) hours as needed for wheezing or shortness of breath (coughing fits).  ?  Dispense:  18 g  ?  Refill:  1  ? albuterol (PROVENTIL) (2.5 MG/3ML) 0.083% nebulizer solution  ?  Sig: Take 3 mLs (2.5 mg total) by nebulization every 4 (four) hours as needed for wheezing or shortness of breath (coughing fits).  ?  Dispense:  75 mL  ?  Refill:  1  ? budesonide (PULMICORT) 0.5 MG/2ML nebulizer solution  ?  Sig: Take 2 mLs (0.5 mg total) by nebulization in the morning and at bedtime.  ?  Dispense:  120 mL  ?  Refill:  2  ? montelukast (SINGULAIR) 10 MG tablet  ?  Sig: Take 1 tablet (10 mg total) by mouth at bedtime.  ?  Dispense:  30 tablet  ?  Refill:  5  ? levocetirizine (XYZAL) 5 MG tablet  ?  Sig: Take 1 tablet (5 mg total) by mouth every evening.  ?  Dispense:  30 tablet  ?  Refill:  5  ? fluticasone (FLONASE) 50 MCG/ACT nasal spray  ?  Sig: Place 1 spray into both nostrils 2 (two) times daily as needed (nasal congestion).  ?  Dispense:  16 g  ?  Refill:  5  ? Olopatadine HCl 0.2 % SOLN  ?  Sig: Apply 1 drop to eye daily as needed (itchy/watery eyes).  ?   Dispense:  2.5 mL  ?  Refill:  5  ? EPINEPHrine 0.3 mg/0.3 mL IJ SOAJ injection  ?  Sig: Inject 0.3 mg into the muscle as needed for anaphylaxis.  ?  Dispense:  1 each  ?  Refill:  2  ?  May dispense generic/Mylan/Teva brand.  ? methylPREDNISolone acetate (DEPO-MEDROL) injection 80 mg  ? ipratropium-albuterol (DUONEB) 0.5-2.5 (3) MG/3ML nebulizer solution 3 mL  ? ?Lab Orders    ?     Allergens w/Total IgE Area 2    ?     CBC with Differential/Platelet    ?     Alpha-1-antitrypsin    ? ? ?Diagnostics: ?Spirometry:  ?Tracings reviewed. Her effort: Good reproducible efforts. ?FVC: 1.35L ?FEV1: 0.88L, 32% predicted ?FEV1/FVC ratio: 65% ?Interpretation: Spirometry consistent with mixed obstructive and restrictive disease with 87% improvement in FEV1 post bronchodilator treatment. Clinically feeling improved.  ?Wheezing resolved. ?Please see scanned spirometry results for details. ? ?Medication List:  ?Current Outpatient Medications  ?Medication Sig Dispense Refill  ? albuterol (PROVENTIL) (2.5 MG/3ML) 0.083% nebulizer solution Take 3 mLs (2.5 mg total) by nebulization every 4 (four)  hours as needed for wheezing or shortness of breath (coughing fits). 75 mL 1  ? albuterol (VENTOLIN HFA) 108 (90 Base) MCG/ACT inhaler Inhale 2 puffs into the lungs every 4 (four) hours as needed for wheezing or shortness of breath (coughing fits). 18 g 1  ? budesonide (PULMICORT) 0.5 MG/2ML nebulizer solution Take 2 mLs (0.5 mg total) by nebulization in the morning and at bedtime. 120 mL 2  ? EPINEPHrine 0.3 mg/0.3 mL IJ SOAJ injection Inject 0.3 mg into the muscle as needed for anaphylaxis. 1 each 2  ? fluticasone (FLONASE) 50 MCG/ACT nasal spray Place 1 spray into both nostrils 2 (two) times daily as needed (nasal congestion). 16 g 5  ? Fluticasone-Umeclidin-Vilant (TRELEGY ELLIPTA) 200-62.5-25 MCG/ACT AEPB Inhale 1 puff into the lungs daily. Rinse mouth after each use. 60 each 3  ? levocetirizine (XYZAL) 5 MG tablet Take 1 tablet (5 mg  total) by mouth every evening. 30 tablet 5  ? montelukast (SINGULAIR) 10 MG tablet Take 1 tablet (10 mg total) by mouth at bedtime. 30 tablet 5  ? Olopatadine HCl 0.2 % SOLN Apply 1 drop to eye daily as needed

## 2021-08-24 NOTE — Assessment & Plan Note (Signed)
Past history - 2022 blood work positive shellfish, mollusks, peanuts, tree nuts.   ?? Continue to avoid shellfish, mollusks, tree nuts. ?? For mild symptoms you can take over the counter antihistamines such as Benadryl and monitor symptoms closely. If symptoms worsen or if you have severe symptoms including breathing issues, throat closure, significant swelling, whole body hives, severe diarrhea and vomiting, lightheadedness then inject epinephrine and seek immediate medical care afterwards. ?? Action plan in place. ?

## 2021-08-24 NOTE — Assessment & Plan Note (Signed)
.   See assessment and plan as above. 

## 2021-08-27 LAB — ALLERGENS W/TOTAL IGE AREA 2
Alternaria Alternata IgE: <0.1 kU/L
Aspergillus Fumigatus IgE: <0.1 kU/L
Bermuda Grass IgE: 3.31 kU/L — AB
Cat Dander IgE: 18 kU/L — AB
Cedar, Mountain IgE: 9.02 kU/L — AB
Cladosporium Herbarum IgE: <0.1 kU/L
Cockroach, German IgE: 27.3 kU/L — AB
Common Silver Birch IgE: 56.7 kU/L — AB
Cottonwood IgE: 4.68 kU/L — AB
D Farinae IgE: 22 kU/L — AB
D Pteronyssinus IgE: 23.1 kU/L — AB
Dog Dander IgE: 31.9 kU/L — AB
Elm, American IgE: 15 kU/L — AB
IgE (Immunoglobulin E), Serum: 1084 IU/mL — ABNORMAL HIGH (ref 6–495)
Johnson Grass IgE: 2.74 kU/L — AB
Maple/Box Elder IgE: 5.68 kU/L — AB
Mouse Urine IgE: <0.1 kU/L
Oak, White IgE: 56.4 kU/L — AB
Pecan, Hickory IgE: 30 kU/L — AB
Penicillium Chrysogen IgE: <0.1 kU/L
Pigweed, Rough IgE: 4.16 kU/L — AB
Ragweed, Short IgE: 10.5 kU/L — AB
Sheep Sorrel IgE Qn: 3.91 kU/L — AB
Timothy Grass IgE: 3.89 kU/L — AB
White Mulberry IgE: 2.15 kU/L — AB

## 2021-08-27 LAB — CBC WITH DIFFERENTIAL/PLATELET
Basophils Absolute: 0.1 10*3/uL (ref 0.0–0.2)
Basos: 1 %
EOS (ABSOLUTE): 0.7 10*3/uL — ABNORMAL HIGH (ref 0.0–0.4)
Eos: 5 %
Hematocrit: 40.4 % (ref 34.0–46.6)
Hemoglobin: 13.2 g/dL (ref 11.1–15.9)
Immature Grans (Abs): 0 10*3/uL (ref 0.0–0.1)
Immature Granulocytes: 0 %
Lymphocytes Absolute: 4 10*3/uL — ABNORMAL HIGH (ref 0.7–3.1)
Lymphs: 27 %
MCH: 25.2 pg — ABNORMAL LOW (ref 26.6–33.0)
MCHC: 32.7 g/dL (ref 31.5–35.7)
MCV: 77 fL — ABNORMAL LOW (ref 79–97)
Monocytes Absolute: 1.3 10*3/uL — ABNORMAL HIGH (ref 0.1–0.9)
Monocytes: 9 %
Neutrophils Absolute: 8.6 10*3/uL — ABNORMAL HIGH (ref 1.4–7.0)
Neutrophils: 58 %
Platelets: 440 10*3/uL (ref 150–450)
RBC: 5.23 x10E6/uL (ref 3.77–5.28)
RDW: 15 % (ref 11.7–15.4)
WBC: 14.7 10*3/uL — ABNORMAL HIGH (ref 3.4–10.8)

## 2021-08-27 LAB — ALPHA-1-ANTITRYPSIN: A-1 Antitrypsin: 172 mg/dL (ref 100–188)

## 2021-10-21 ENCOUNTER — Telehealth: Payer: Self-pay | Admitting: Allergy

## 2021-10-21 NOTE — Telephone Encounter (Signed)
Patient called and stated that she received a nebulizer from [place], but it is no longer working. Patient states that she doesn't know what is wrong with it, but it doesn't turn on. Patient requests a call back at [phone]

## 2021-10-21 NOTE — Telephone Encounter (Signed)
Called and informed patient to call the 1-800 number on the nebulizer machine and let them know about the issue.

## 2021-11-02 ENCOUNTER — Inpatient Hospital Stay (HOSPITAL_COMMUNITY): Payer: Medicaid Other

## 2021-11-02 ENCOUNTER — Encounter (HOSPITAL_COMMUNITY): Payer: Self-pay | Admitting: *Deleted

## 2021-11-02 ENCOUNTER — Inpatient Hospital Stay (HOSPITAL_COMMUNITY)
Admission: AD | Admit: 2021-11-02 | Discharge: 2021-11-02 | Disposition: A | Discharge status: Home / Self Care | Payer: Medicaid Other | Attending: Family Medicine | Admitting: Family Medicine

## 2021-11-02 DIAGNOSIS — R1030 Lower abdominal pain, unspecified: Secondary | ICD-10-CM | POA: Insufficient documentation

## 2021-11-02 DIAGNOSIS — Z3A01 Less than 8 weeks gestation of pregnancy: Secondary | ICD-10-CM

## 2021-11-02 DIAGNOSIS — O209 Hemorrhage in early pregnancy, unspecified: Secondary | ICD-10-CM | POA: Diagnosis not present

## 2021-11-02 DIAGNOSIS — R109 Unspecified abdominal pain: Secondary | ICD-10-CM

## 2021-11-02 DIAGNOSIS — R102 Pelvic and perineal pain: Secondary | ICD-10-CM | POA: Insufficient documentation

## 2021-11-02 DIAGNOSIS — O26891 Other specified pregnancy related conditions, first trimester: Secondary | ICD-10-CM | POA: Insufficient documentation

## 2021-11-02 DIAGNOSIS — O26899 Other specified pregnancy related conditions, unspecified trimester: Secondary | ICD-10-CM

## 2021-11-02 DIAGNOSIS — N939 Abnormal uterine and vaginal bleeding, unspecified: Secondary | ICD-10-CM

## 2021-11-02 LAB — WET PREP, GENITAL
Clue Cells Wet Prep HPF POC: NONE SEEN
Sperm: NONE SEEN
Trich, Wet Prep: NONE SEEN
WBC, Wet Prep HPF POC: <10 (ref <10)
Yeast Wet Prep HPF POC: NONE SEEN

## 2021-11-02 LAB — URINALYSIS, ROUTINE W REFLEX MICROSCOPIC
Bilirubin Urine: NEGATIVE
Glucose, UA: NEGATIVE mg/dL
Hgb urine dipstick: NEGATIVE
Ketones, ur: NEGATIVE mg/dL
Leukocytes,Ua: NEGATIVE
Nitrite: NEGATIVE
Protein, ur: NEGATIVE mg/dL
Specific Gravity, Urine: 1.027 (ref 1.005–1.030)
pH: 5 (ref 5.0–8.0)

## 2021-11-02 LAB — HCG, QUANTITATIVE, PREGNANCY: hCG, Beta Chain, Quant, S: 3191 m[IU]/mL — ABNORMAL HIGH (ref <5)

## 2021-11-02 LAB — CBC
HCT: 37.8 % (ref 36.0–46.0)
Hemoglobin: 12.2 g/dL (ref 12.0–15.0)
MCH: 25.7 pg — ABNORMAL LOW (ref 26.0–34.0)
MCHC: 32.3 g/dL (ref 30.0–36.0)
MCV: 79.6 fL — ABNORMAL LOW (ref 80.0–100.0)
Platelets: 475 10*3/uL — ABNORMAL HIGH (ref 150–400)
RBC: 4.75 MIL/uL (ref 3.87–5.11)
RDW: 14.7 % (ref 11.5–15.5)
WBC: 15 10*3/uL — ABNORMAL HIGH (ref 4.0–10.5)
nRBC: 0 % (ref 0.0–0.2)

## 2021-11-02 NOTE — MAU Note (Signed)
Tracy Crawford is a 21 y.o.  here in MAU reporting: missed her period.  +HPT today.  Has been cramping.  So came here.  Has been spotting on and off, first noted last night, light pink.  Had some vomiting last wk. LMP: 5/27 Onset of complaint: couple of days Pain score: moderate Vitals:   11/02/21 1432  BP: 136/68  Pulse: 73  Resp: 17  Temp: 99.2 F (37.3 C)  SpO2: 100%      Lab orders placed from triage:  UPT, UA if +

## 2021-11-02 NOTE — Discharge Instructions (Signed)
[facility]   [facility]             [address], [place], [place] [address] [phone]  [facility]                                                             [address], [address], [place], [place], [address] [phone]  [facility]                                    [address], [address], [place], [place], [address] [phone]  [facility] [place] [address], [address], [place], [place], [address] [phone]  [facility]                                 [address], [place], [place], [address] [phone]  [facility]                                    [address], [place], [place], [address] [phone]  [facility] [address], [address], [place], [place], [address]                               [facility] [address], [address], [place], [place], [address] [phone]  [facility]         Phone: [phone]  [facility]      Phone: [phone]   [facility]      Phone: [phone]  [facility]         Phone: [phone]   [facility]    Phone: [phone]  [facility]      Phone: [phone]  [facility]      Phone: [phone]  [facility]        Phone: [phone]  [facility]      Phone: [phone]  

## 2021-11-02 NOTE — MAU Provider Note (Signed)
History     CSN: [id number]  Arrival date and time: 11/02/21 1402   Event Date/Time   First Provider Initiated Contact with Patient 11/02/21 1442      Chief Complaint  Patient presents with   Abdominal Pain   Possible Pregnancy   HPI Tracy Crawford is a 21 y.o. G3P1011 at [redacted]w[redacted]d by LMP who presents to MAU for lower abdominal cramping and vaginal spotting. Patient reports she started having intermittent lower abdominal cramping and occasional spotting with wiping yesterday. She reports she had a positive home pregnancy test today. She denies itching, odor, heavy bleeding, or urinary s/s. LMP was 09/26/21. Patient does not have OBGYN but is considering going back to [facility] as this is where she received prenatal care with her first pregnancy.  OB History     Gravida  3   Para  1   Term  1   Preterm      AB  1   Living  1      SAB  1   IAB      Ectopic      Multiple      Live Births  1           Past Medical History:  Diagnosis Date   Anxiety    Asthma    non compliant with medication    Food allergy    SHELLFISH, TREE NUTS   HSV (herpes simplex virus) infection    Hypertension    PIH   Miscarriage     Past Surgical History:  Procedure Laterality Date   NO PAST SURGERIES     TOOTH EXTRACTION Bilateral 03/10/2021   Procedure: DENTAL RESTORATION/EXTRACTIONS;  Surgeon: Ocie Doyne, DMD;  Location: [facility];  Service: Oral Surgery;  Laterality: Bilateral;    Family History  Problem Relation Age of Onset   Asthma Father    Asthma Maternal Aunt    Asthma Paternal Grandmother    Allergic rhinitis Neg Hx    Angioedema Neg Hx    Immunodeficiency Neg Hx    Eczema Neg Hx    Urticaria Neg Hx     Social History   Tobacco Use   Smoking status: Never    Passive exposure: Never   Smokeless tobacco: Never  Vaping Use   Vaping Use: Never used  Substance Use Topics   Alcohol use: No   Drug use: Yes    Frequency: 7.0 times per week     Types: Marijuana    Comment: currently last smoked over this past weekend    Allergies:  Allergies  Allergen Reactions   Other Anaphylaxis   Shellfish Allergy Anaphylaxis   Apple Juice Itching   Augmentin [Amoxicillin-Pot Clavulanate] Hives    Has patient had a PCN reaction causing immediate rash, facial/tongue/throat swelling, SOB or lightheadedness with hypotension: Unknown Has patient had a PCN reaction causing severe rash involving mucus membranes or skin necrosis: Unknown Has patient had a PCN reaction that required hospitalization: Unknown Has patient had a PCN reaction occurring within the last 10 years: Yes If all of the above answers are "NO", then may proceed with Cephalosporin use.    Banana Itching   Ceftin [Cefuroxime Axetil] Hives    Unknown reaction   Cherry    Peach [Prunus Persica] Itching   Plum Pulp Itching   Watermelon [Citrullus Vulgaris] Itching    No medications prior to admission.    Review of Systems  Constitutional: Negative.   Respiratory: Negative.  Cardiovascular: Negative.   Gastrointestinal:  Positive for abdominal pain (cramping).  Genitourinary:  Positive for vaginal bleeding (spotting). Negative for dysuria, frequency and vaginal discharge.  Neurological: Negative.    Physical Exam   Blood pressure 136/68, pulse 73, temperature 99.2 F (37.3 C), temperature source Oral, resp. rate 17, height 5' 2.5" (1.588 m), weight 92.1 kg, last menstrual period 09/26/2021, SpO2 100 %, unknown if currently breastfeeding.  Physical Exam Vitals and nursing note reviewed.  Constitutional:      General: She is not in acute distress. Eyes:     Pupils: Pupils are equal, round, and reactive to light.  Cardiovascular:     Rate and Rhythm: Normal rate.  Pulmonary:     Effort: Pulmonary effort is normal.  Abdominal:     Palpations: Abdomen is soft.     Tenderness: There is no abdominal tenderness.  Genitourinary:    Comments: Patient  self-swabbed Musculoskeletal:        General: Normal range of motion.     Cervical back: Normal range of motion.  Skin:    General: Skin is warm and dry.  Neurological:     General: No focal deficit present.     Mental Status: She is alert and oriented to person, place, and time.  Psychiatric:        Mood and Affect: Mood normal.        Behavior: Behavior normal.        Thought Content: Thought content normal.        Judgment: Judgment normal.   US OB LESS THAN 14 WEEKS WITH OB TRANSVAGINAL  Result Date: 11/02/2021 CLINICAL DATA:  Cramping and spotting. EXAM: OBSTETRIC <14 WK [place] AND TRANSVAGINAL OB US TECHNIQUE: Both transabdominal and transvaginal ultrasound examinations were performed for complete evaluation of the gestation as well as the maternal uterus, adnexal regions, and pelvic cul-de-sac. Transvaginal technique was performed to assess early pregnancy. COMPARISON:  None Available. FINDINGS: Intrauterine gestational sac: Single Yolk sac:  Not present. Embryo:  Not present. MSD: 4.16 mm   5 w   1 d Subchorionic hemorrhage:  Small. Maternal uterus/adnexae: Ovaries are visualized.  Trace free fluid. IMPRESSION: 1. Single intrauterine pregnancy with gestational age [redacted] weeks 1 day. 2. Small subchorionic hemorrhage. Electronically Signed   By: Leanna Battles M.D.   On: 11/02/2021 16:01    MAU Course  Procedures  MDM UA unremarkable CBC stable, HCG 3100 Wet prep negative, GC/CT pending Blood type is B positive, Rhogam not indicated [place] with results as above. IUGS without YS or FP. Discussed with Dr. Macon Large and will order viability ultrasound in 10-14 days   Assessment and Plan  Vaginal spotting IUGS   - Discharge home in stable condition - Follow up ultrasound for viability in 10-14 days - Strict return precautions reviewed - Return to MAU as needed for worsening symptoms - List of OBGYN's provided   Brand Males, CNM 11/02/2021, 5:10 PM

## 2021-11-04 LAB — GC/CHLAMYDIA PROBE AMP (~~LOC~~) NOT AT ARMC
Chlamydia: NEGATIVE
Comment: NEGATIVE
Comment: NORMAL
Neisseria Gonorrhea: NEGATIVE

## 2021-11-05 ENCOUNTER — Other Ambulatory Visit: Payer: Self-pay

## 2021-11-05 MED ORDER — ALBUTEROL SULFATE HFA 108 (90 BASE) MCG/ACT IN AERS: 2.0000 | INHALATION_SPRAY | RESPIRATORY_TRACT | 1 refills | Status: DC | PRN

## 2021-11-11 ENCOUNTER — Inpatient Hospital Stay (HOSPITAL_COMMUNITY)
Admission: AD | Admit: 2021-11-11 | Discharge: 2021-11-11 | Disposition: A | Discharge status: Home / Self Care | Payer: Medicaid Other | Attending: Obstetrics & Gynecology | Admitting: Obstetrics & Gynecology

## 2021-11-11 DIAGNOSIS — Z3A01 Less than 8 weeks gestation of pregnancy: Secondary | ICD-10-CM

## 2021-11-11 DIAGNOSIS — O219 Vomiting of pregnancy, unspecified: Secondary | ICD-10-CM | POA: Insufficient documentation

## 2021-11-11 DIAGNOSIS — Z3A00 Weeks of gestation of pregnancy not specified: Secondary | ICD-10-CM | POA: Diagnosis not present

## 2021-11-11 MED ORDER — METOCLOPRAMIDE HCL 10 MG PO TABS: 10.0000 mg | ORAL_TABLET | Freq: Four times a day (QID) | ORAL | 2 refills | Status: DC

## 2021-11-11 NOTE — MAU Provider Note (Signed)
Event Date/Time   First Provider Initiated Contact with Patient 11/11/21 1310      S [name] is a 21 y.o. G3P1011 patient who presents to MAU today with complaint of nausea and vomiting in pregnancy. States she has not been able to keep anything down. She has not tried to improve nausea and vomiting at home. She also has concerns for her previous ultrasound results.    O Temp 97.6 F (36.4 C) (Oral)   Resp 16   LMP 09/26/2021  Physical Exam Vitals and nursing note reviewed.  Constitutional:      General: She is not in acute distress.    Appearance: Normal appearance.  HENT:     Head: Normocephalic.     Mouth/Throat:     Mouth: Mucous membranes are moist.  Pulmonary:     Effort: Pulmonary effort is normal.  Musculoskeletal:     Cervical back: Normal range of motion.  Skin:    General: Skin is warm and dry.  Neurological:     Mental Status: She is alert and oriented to person, place, and time.  Psychiatric:        Mood and Affect: Mood normal.   Patient actively drinking water, while talking to CNM.  Reviewed [place] results at bedside.   A Medical screening exam complete Nausea and vomiting in pregnancy.   P - CNM at bedside and dicussed [place] results with patient. Discussed the importance of a follow-up viability ultrasound. Message sent to [facility] to get patient scheduled at earliest availability.   - PO Reglan sent to outpatient pharmacy for nausea and vomiting in pregnancy. - Bleeding expectations and precautions discussed with patient.   - Discharge from MAU in stable condition - Warning signs for worsening condition that would warrant emergency follow-up discussed - Patient may return to MAU as needed   Carlynn Herald, CNM 11/11/2021 1:35 PM

## 2021-11-11 NOTE — MAU Note (Signed)
Pt was seen in MAU in the last 2 weeks.  She is unsure of her diagnosis but asked about a hemorrhage they saw on [place].  She was told that someone would contact her from the office but no one has yet, so she is back to speak to someone about it.   She is also c/o nausea and vomiting 2x per day for the last 4 days.  She would like to talk to someone about getting Zofran.    No VB, some stomach pain in upper abdomen.

## 2021-11-24 ENCOUNTER — Ambulatory Visit (INDEPENDENT_AMBULATORY_CARE_PROVIDER_SITE_OTHER): Payer: Medicaid Other

## 2021-11-24 ENCOUNTER — Other Ambulatory Visit (HOSPITAL_COMMUNITY): Payer: Self-pay

## 2021-11-24 VITALS — BP 114/76 | HR 88 | Temp 97.6°F

## 2021-11-24 DIAGNOSIS — O3680X0 Pregnancy with inconclusive fetal viability, not applicable or unspecified: Secondary | ICD-10-CM

## 2021-11-24 DIAGNOSIS — Z3A08 8 weeks gestation of pregnancy: Secondary | ICD-10-CM | POA: Diagnosis not present

## 2021-11-24 DIAGNOSIS — Z3491 Encounter for supervision of normal pregnancy, unspecified, first trimester: Secondary | ICD-10-CM

## 2021-11-24 DIAGNOSIS — Z3A01 Less than 8 weeks gestation of pregnancy: Secondary | ICD-10-CM

## 2021-11-24 DIAGNOSIS — N939 Abnormal uterine and vaginal bleeding, unspecified: Secondary | ICD-10-CM

## 2021-11-24 DIAGNOSIS — O26899 Other specified pregnancy related conditions, unspecified trimester: Secondary | ICD-10-CM

## 2021-11-24 NOTE — Progress Notes (Signed)
GYNECOLOGY OFFICE VISIT NOTE  History:   Tracy Crawford is a 21 y.o. G3P1011 here today for ultrasound results.  Ultrasound shows live IUP measuring 8 weeks and 3 days with FHR of 159.  Abdominal pain: No   Vaginal bleeding: No    OB History  Gravida Para Term Preterm AB Living  3 1 1   1 1   SAB IAB Ectopic Multiple Live Births  1       1    # Outcome Date GA Lbr Len/2nd Weight Sex Delivery Anes PTL Lv  3 Current           2 SAB 10/2018             Birth Comments: states took a home pregnancy test, then passed a lot of blood/ clots/ cramps but never went to doctor  1 Term 02/26/18 [redacted]w[redacted]d  7 lb 10 oz (3.459 kg)  Vag-Spont   LIV     Birth Comments: GHTN/ Preeclampsia, hyperemesis     Health Maintenance Due  Topic Date Due   COVID-19 Vaccine (1) Never done   HPV VACCINES (1 - 2-dose series) Never done   Hepatitis C Screening  Never done   TETANUS/TDAP  Never done   PAP-Cervical Cytology Screening  Never done   PAP SMEAR-Modifier  Never done    Past Medical History:  Diagnosis Date   Anxiety    Asthma    non compliant with medication    Food allergy    SHELLFISH, TREE NUTS   HSV (herpes simplex virus) infection    Hypertension    PIH   Miscarriage     Past Surgical History:  Procedure Laterality Date   NO PAST SURGERIES     TOOTH EXTRACTION Bilateral 03/10/2021   Procedure: DENTAL RESTORATION/EXTRACTIONS;  Surgeon: 13/12/2020, DMD;  Location: [facility];  Service: Oral Surgery;  Laterality: Bilateral;    The following portions of the patient's history were reviewed and updated as appropriate: allergies, current medications, past family history, past medical history, past social history, past surgical history and problem list.   Review of Systems:  Pertinent items noted in HPI and remainder of comprehensive ROS otherwise negative.  Physical Exam:  BP 114/76   Pulse 88   Temp 97.6 F (36.4 C)   LMP 09/26/2021  CONSTITUTIONAL:  Well-developed, well-nourished female in no acute distress.  HEENT:  Normocephalic, atraumatic. External right and left ear normal. No scleral icterus.  NECK: Normal range of motion, supple, no masses noted on observation SKIN: No rash noted. Not diaphoretic. No erythema. No pallor. MUSCULOSKELETAL: Normal range of motion. No edema noted. NEUROLOGIC: Alert and oriented to person, place, and time. Normal muscle tone coordination.  PSYCHIATRIC: Normal mood and affect. Normal behavior. Normal judgment and thought content. RESPIRATORY: Effort normal, no problems with respiration noted  Assessment and Plan:  Viable Pregnancy - Z34.90  Reviewed results with patient that show a viable intrauterine pregnancy at 8 weeks of gestation. Recommended she contact providers to start prenatal care, and she was given a list of options in her AVS. Prescription sent for prenatal vitamins. Reviewed ED precautions including vaginal bleeding like a period or heavier, severe abdominal pain, and fever. All questions answered.   Please refer to After Visit Summary for other counseling recommendations.   Patient to follow up to establish prenatal care as soon as possible. Patient reports she has appointment at [facility] next week.   Total face-to-face time with patient: 20 minutes.  Over 50% of encounter was spent on counseling and coordination of care.   Rolm Bookbinder, CNM [facility] [employer]

## 2021-11-30 ENCOUNTER — Telehealth: Payer: Self-pay

## 2021-11-30 NOTE — Telephone Encounter (Signed)
Patient called to report she is needing another rescue inhaler sent to her pharmacy. She states she is living in an apartment without [facility] and is having to use the rescue inhaler every day. She states she never got the Trelegy that was sent in, she did get the neb meds but states when she moved the nebulizer machine was lost by the moving men. Per [employer] Trelegy requires a PA, we never received this notification, they are resending.  She is asking for a refill on Albuterol HFA (11/05/21, 1rf) and a new nebulizer machine. She does have an appt scheduled for this coming Wednesday.  Per [facility], patient picked up Albuterol HFA 11/13/21 and has not used the refill as yet.   ATC patient to advise there is another refill available at the pharmacy. Patient did not answer and has no vm set up. Unable to reach patient at this time.

## 2021-12-01 LAB — OB RESULTS CONSOLE RPR: RPR: NONREACTIVE

## 2021-12-01 LAB — OB RESULTS CONSOLE ABO/RH: RH Type: POSITIVE

## 2021-12-01 LAB — OB RESULTS CONSOLE RUBELLA ANTIBODY, IGM: Rubella: IMMUNE

## 2021-12-01 LAB — OB RESULTS CONSOLE ANTIBODY SCREEN: Antibody Screen: NEGATIVE

## 2021-12-01 LAB — OB RESULTS CONSOLE GC/CHLAMYDIA
Chlamydia: NEGATIVE
Neisseria Gonorrhea: NEGATIVE

## 2021-12-01 LAB — OB RESULTS CONSOLE HEPATITIS B SURFACE ANTIGEN: Hepatitis B Surface Ag: NEGATIVE

## 2021-12-01 LAB — OB RESULTS CONSOLE HIV ANTIBODY (ROUTINE TESTING): HIV: NONREACTIVE

## 2021-12-01 LAB — HEPATITIS C ANTIBODY: HCV Ab: NEGATIVE

## 2021-12-01 NOTE — Progress Notes (Deleted)
Follow Up Note  RE: Tracy Crawford MRN: [id number] DOB: 2001-03-20 Date of Office Visit: 12/02/2021  Referring provider: No ref. provider found Primary care provider: Pcp, No  Chief Complaint: No chief complaint on file.  History of Present Illness: I had the pleasure of seeing Tracy Crawford for a follow up visit at the [facility] on 12/01/2021. She is a 21 y.o. female, who is being followed for asthma, allergic rhinoconjunctivitis, food allergy and oral allergy syndrome. Her previous allergy office visit was on 08/24/2021 with Dr. Selena Batten. Today is a regular follow up visit.  ?  Pregnancy  Severe persistent asthma with (acute) exacerbation Ran out of maintenance inhaler Breztri. Takes singulair and albuterol multiple times a day. No recent prednisone use. Called 911 twice last night for nebulizer treatment. Today's spirometry showed severe mixed obstruction and restriction with 87% improvement in FEV1 post bronchodilator treatment. Clinically feeling improved, wheezing resolved.  Depo IM 80mg  given today.  Start prednisone taper. Stressed the importance of medication adherence and avoiding smoking including second hand smoking exposure.  Get bloodwork to see if qualify for biologics for asthma.  Daily controller medication(s): Start Trelegy 1 puff once a day and rinse mouth after each use. Samples given. Demonstrated proper use.  Continue Singulair (montelukast) 10mg  daily at night. Nebulizer machine given.  During upper respiratory infections/flares:  Start Pulmicort (budesonide) 0.5mg  nebulizer twice a day for 1-2 weeks until your breathing symptoms return to baseline.  Pretreat with albuterol 2 puffs or albuterol nebulizer.  If you need to use your albuterol nebulizer machine back to back within 15-30 minutes with no relief then please go to the ER/urgent care for further evaluation.  May use albuterol rescue inhaler 2 puffs or nebulizer every 4 to 6 hours as  needed for shortness of breath, chest tightness, coughing, and wheezing. May use albuterol rescue inhaler 2 puffs 5 to 15 minutes prior to strenuous physical activities. Monitor frequency of use.  Get spirometry at next visit.   Other allergic rhinitis No recent testing. Takes Flonase and pataday prn. Start Xyzal (levocetirizine) 5mg  daily. Use Flonase (fluticasone) nasal spray 1 spray per nostril twice a day as needed for nasal congestion.  Nasal saline spray (i.e., Simply Saline) or nasal saline lavage (i.e., NeilMed) is recommended as needed and prior to medicated nasal sprays. Use olopatadine eye drops 0.2% once a day as needed for itchy/watery eyes. Get bloodwork.   Allergic conjunctivitis of both eyes See assessment and plan as above.   Anaphylactic reaction due to food, subsequent encounter Past history - 2022 blood work positive shellfish, mollusks, peanuts, tree nuts.   Continue to avoid shellfish, mollusks, tree nuts. For mild symptoms you can take over the counter antihistamines such as Benadryl and monitor symptoms closely. If symptoms worsen or if you have severe symptoms including breathing issues, throat closure, significant swelling, whole body hives, severe diarrhea and vomiting, lightheadedness then inject epinephrine and seek immediate medical care afterwards. Action plan in place.   Oral allergy syndrome, subsequent encounter Continue to avoid fresh fruits that are bothersome.   Return in about 4 weeks (around 09/21/2021).    Assessment and Plan: Tracy Crawford is a 21 y.o. female with: No problem-specific Assessment & Plan notes found for this encounter.  No follow-ups on file.  No orders of the defined types were placed in this encounter.  Lab Orders  No laboratory test(s) ordered today    Diagnostics: Spirometry:  Tracings reviewed. Her effort: {Blank single:19197::"Good  reproducible efforts.","It was hard to get consistent efforts and there is a question as to  whether this reflects a maximal maneuver.","Poor effort, data can not be interpreted."} FVC: ***L FEV1: ***L, ***% predicted FEV1/FVC ratio: ***% Interpretation: {Blank single:19197::"Spirometry consistent with mild obstructive disease","Spirometry consistent with moderate obstructive disease","Spirometry consistent with severe obstructive disease","Spirometry consistent with possible restrictive disease","Spirometry consistent with mixed obstructive and restrictive disease","Spirometry uninterpretable due to technique","Spirometry consistent with normal pattern","No overt abnormalities noted given today's efforts"}.  Please see scanned spirometry results for details.  Skin Testing: {Blank single:19197::"Select foods","Environmental allergy panel","Environmental allergy panel and select foods","Food allergy panel","None","Deferred due to recent antihistamines use"}. *** Results discussed with patient/family.   Medication List:  Current Outpatient Medications  Medication Sig Dispense Refill   albuterol (PROVENTIL) (2.5 MG/3ML) 0.083% nebulizer solution Take 3 mLs (2.5 mg total) by nebulization every 4 (four) hours as needed for wheezing or shortness of breath (coughing fits). 75 mL 1   albuterol (VENTOLIN HFA) 108 (90 Base) MCG/ACT inhaler Inhale 2 puffs into the lungs every 4 (four) hours as needed for wheezing or shortness of breath (coughing fits). 18 g 1   budesonide (PULMICORT) 0.5 MG/2ML nebulizer solution Take 2 mLs (0.5 mg total) by nebulization in the morning and at bedtime. 120 mL 2   EPINEPHrine 0.3 mg/0.3 mL IJ SOAJ injection Inject 0.3 mg into the muscle as needed for anaphylaxis. 1 each 2   famotidine (PEPCID) 20 MG tablet Take 20 mg by mouth 2 (two) times daily.     fluticasone (FLONASE) 50 MCG/ACT nasal spray Place 1 spray into both nostrils 2 (two) times daily as needed (nasal congestion). 16 g 5   Fluticasone-Umeclidin-Vilant (TRELEGY ELLIPTA) 200-62.5-25 MCG/ACT AEPB Inhale 1  puff into the lungs daily. Rinse mouth after each use. 60 each 3   hydrOXYzine (ATARAX) 10 MG tablet Take 10-20 mg by mouth every 4 (four) hours as needed.     levocetirizine (XYZAL) 5 MG tablet Take 1 tablet (5 mg total) by mouth every evening. 30 tablet 5   metoCLOPramide (REGLAN) 10 MG tablet Take 1 tablet (10 mg total) by mouth in the morning, at noon, in the evening, and at bedtime. 90 tablet 2   montelukast (SINGULAIR) 10 MG tablet Take 1 tablet (10 mg total) by mouth at bedtime. 30 tablet 5   Olopatadine HCl 0.2 % SOLN Apply 1 drop to eye daily as needed (itchy/watery eyes). 2.5 mL 5   ondansetron (ZOFRAN-ODT) 8 MG disintegrating tablet Take by mouth.     No current facility-administered medications for this visit.   Allergies: Allergies  Allergen Reactions   Other Anaphylaxis   Shellfish Allergy Anaphylaxis   Apple Juice Itching   Augmentin [Amoxicillin-Pot Clavulanate] Hives    Has patient had a PCN reaction causing immediate rash, facial/tongue/throat swelling, SOB or lightheadedness with hypotension: Unknown Has patient had a PCN reaction causing severe rash involving mucus membranes or skin necrosis: Unknown Has patient had a PCN reaction that required hospitalization: Unknown Has patient had a PCN reaction occurring within the last 10 years: Yes If all of the above answers are "NO", then may proceed with Cephalosporin use.    Banana Itching   Ceftin [Cefuroxime Axetil] Hives    Unknown reaction   Cherry    Peach [Prunus Persica] Itching   Plum Pulp Itching   Watermelon [Citrullus Vulgaris] Itching   I reviewed her past medical history, social history, family history, and environmental history and no significant changes have been reported from her  previous visit.  Review of Systems  Constitutional:  Negative for appetite change, chills, fever and unexpected weight change.  HENT:  Positive for congestion. Negative for rhinorrhea.   Eyes:  Positive for itching.   Respiratory:  Positive for cough, chest tightness, shortness of breath and wheezing.   Gastrointestinal:  Negative for abdominal pain.  Skin:  Negative for rash.  Allergic/Immunologic: Positive for food allergies.  Neurological:  Negative for headaches.    Objective: LMP 09/26/2021  There is no height or weight on file to calculate BMI. Physical Exam Vitals and nursing note reviewed.  Constitutional:      Appearance: Normal appearance. She is well-developed.  HENT:     Head: Normocephalic and atraumatic.     Right Ear: Tympanic membrane and external ear normal.     Left Ear: Tympanic membrane and external ear normal.     Nose: Nose normal.     Mouth/Throat:     Mouth: Mucous membranes are moist.     Pharynx: Oropharynx is clear.  Eyes:     Conjunctiva/sclera: Conjunctivae normal.  Cardiovascular:     Rate and Rhythm: Normal rate and regular rhythm.     Heart sounds: Normal heart sounds. No murmur heard. Pulmonary:     Effort: Pulmonary effort is normal.     Breath sounds: Wheezing present. No rhonchi or rales.     Comments: Wheezing resolved after bronchodilator treatment.  Musculoskeletal:     Cervical back: Neck supple.  Skin:    General: Skin is warm.     Findings: No rash.  Neurological:     Mental Status: She is alert and oriented to person, place, and time.  Psychiatric:        Behavior: Behavior normal.    Previous notes and tests were reviewed. The plan was reviewed with the patient/family, and all questions/concerned were addressed.  It was my pleasure to see Tracy Crawford today and participate in her care. Please feel free to contact me with any questions or concerns.  Sincerely,  Wyline Mood, DO Allergy & Immunology  [facility] office: [phone] [facility] office: [phone]

## 2021-12-02 ENCOUNTER — Ambulatory Visit: Payer: Medicaid Other | Admitting: Allergy

## 2021-12-02 DIAGNOSIS — T7800XD Anaphylactic reaction due to unspecified food, subsequent encounter: Secondary | ICD-10-CM

## 2021-12-02 DIAGNOSIS — H1013 Acute atopic conjunctivitis, bilateral: Secondary | ICD-10-CM

## 2021-12-02 DIAGNOSIS — T781XXD Other adverse food reactions, not elsewhere classified, subsequent encounter: Secondary | ICD-10-CM

## 2021-12-02 DIAGNOSIS — J3089 Other allergic rhinitis: Secondary | ICD-10-CM

## 2021-12-03 NOTE — Telephone Encounter (Signed)
Received fax from [facility]/ [address] - medication refill on Trelegy Ellipta 200-62.5 MCG inhaler - DENIED: Patient must contact office to schedule office visit. LOV: 08/24/21 was suppose to f/u in 4 weeks.

## 2021-12-30 ENCOUNTER — Inpatient Hospital Stay (HOSPITAL_COMMUNITY)
Admission: AD | Admit: 2021-12-30 | Discharge: 2021-12-30 | Disposition: A | Discharge status: Home / Self Care | Payer: Medicaid Other | Attending: Obstetrics & Gynecology | Admitting: Obstetrics & Gynecology

## 2021-12-30 ENCOUNTER — Encounter (HOSPITAL_COMMUNITY): Payer: Self-pay | Admitting: Obstetrics & Gynecology

## 2021-12-30 ENCOUNTER — Other Ambulatory Visit: Payer: Self-pay

## 2021-12-30 DIAGNOSIS — R519 Headache, unspecified: Secondary | ICD-10-CM | POA: Diagnosis not present

## 2021-12-30 DIAGNOSIS — R42 Dizziness and giddiness: Secondary | ICD-10-CM | POA: Diagnosis not present

## 2021-12-30 DIAGNOSIS — O26891 Other specified pregnancy related conditions, first trimester: Secondary | ICD-10-CM | POA: Diagnosis present

## 2021-12-30 DIAGNOSIS — Z3A13 13 weeks gestation of pregnancy: Secondary | ICD-10-CM

## 2021-12-30 DIAGNOSIS — O219 Vomiting of pregnancy, unspecified: Secondary | ICD-10-CM

## 2021-12-30 LAB — URINALYSIS, ROUTINE W REFLEX MICROSCOPIC
Bilirubin Urine: NEGATIVE
Glucose, UA: NEGATIVE mg/dL
Ketones, ur: 80 mg/dL — AB
Leukocytes,Ua: NEGATIVE
Nitrite: NEGATIVE
Protein, ur: 30 mg/dL — AB
Specific Gravity, Urine: 1.032 — ABNORMAL HIGH (ref 1.005–1.030)
pH: 5 (ref 5.0–8.0)

## 2021-12-30 MED ORDER — ALBUTEROL SULFATE HFA 108 (90 BASE) MCG/ACT IN AERS: 2.0000 | INHALATION_SPRAY | RESPIRATORY_TRACT | 2 refills | Status: DC | PRN

## 2021-12-30 MED ORDER — ONDANSETRON 8 MG PO TBDP: 8.0000 mg | ORAL_TABLET | Freq: Three times a day (TID) | ORAL | 2 refills | Status: DC | PRN

## 2021-12-30 MED ORDER — ONDANSETRON 4 MG PO TBDP
8.0000 mg | ORAL_TABLET | Freq: Once | ORAL | Status: AC
Start: 2021-12-30 — End: 2021-12-30
  Administered 2021-12-30: 8 mg via ORAL
  Filled 2021-12-30: qty 2

## 2021-12-30 MED ORDER — LACTATED RINGERS IV BOLUS
1000.0000 mL | Freq: Once | INTRAVENOUS | Status: AC
  Administered 2021-12-30: 1000 mL via INTRAVENOUS

## 2021-12-30 MED ORDER — CYCLOBENZAPRINE HCL 5 MG PO TABS
10.0000 mg | ORAL_TABLET | Freq: Once | ORAL | Status: AC
  Administered 2021-12-30: 10 mg via ORAL
  Filled 2021-12-30: qty 2

## 2021-12-30 MED ORDER — TRELEGY ELLIPTA 200-62.5-25 MCG/ACT IN AEPB: 1.0000 | INHALATION_SPRAY | Freq: Every day | RESPIRATORY_TRACT | 1 refills | Status: DC

## 2021-12-30 MED ORDER — BUDESONIDE 0.5 MG/2ML IN SUSP: 0.5000 mg | Freq: Two times a day (BID) | RESPIRATORY_TRACT | 1 refills | Status: DC

## 2021-12-30 MED ORDER — ALBUTEROL SULFATE (2.5 MG/3ML) 0.083% IN NEBU: 2.5000 mg | INHALATION_SOLUTION | RESPIRATORY_TRACT | 2 refills | Status: DC | PRN

## 2021-12-30 NOTE — MAU Provider Note (Signed)
History     CSN: [id number]  Arrival date and time: 12/30/21 0904   Event Date/Time   First Provider Initiated Contact with Patient 12/30/21 [phone]      Chief Complaint  Patient presents with   Emesis   Headache   Dizziness   HPI  Tracy Crawford is a 21 y.o. G2P1001 at [redacted]w[redacted]d who presents for evaluation of nausea, vomiting, dizziness and headache. Patient reports she has been struggling with nausea and vomiting during the pregnancy. She reports she takes reglan with no relief and in her past pregnancy, zofran worked well but she doesn't have any. She reports she has a constant headache and feels dizzy sometimes. Patient rates the pain as a 6/10 and has tried tylenol for the pain with no relief.  She denies any vaginal bleeding, discharge, and leaking of fluid. Denies any constipation, diarrhea or any urinary complaints.  OB History     Gravida  2   Para  1   Term  1   Preterm      AB  0   Living  1      SAB  0   IAB      Ectopic      Multiple      Live Births  1           Past Medical History:  Diagnosis Date   Anxiety    Asthma    non compliant with medication    Food allergy    SHELLFISH, TREE NUTS   HSV (herpes simplex virus) infection    Hypertension    PIH   Miscarriage     Past Surgical History:  Procedure Laterality Date   NO PAST SURGERIES     TOOTH EXTRACTION Bilateral 03/10/2021   Procedure: DENTAL RESTORATION/EXTRACTIONS;  Surgeon: Ocie Doyne, DMD;  Location:  [facility];  Service: Oral Surgery;  Laterality: Bilateral;    Family History  Problem Relation Age of Onset   Asthma Father    Asthma Maternal Aunt    Asthma Paternal Grandmother    Allergic rhinitis Neg Hx    Angioedema Neg Hx    Immunodeficiency Neg Hx    Eczema Neg Hx    Urticaria Neg Hx     Social History   Tobacco Use   Smoking status: Never    Passive exposure: Never   Smokeless tobacco: Never  Vaping Use   Vaping Use: Never used   Substance Use Topics   Alcohol use: No   Drug use: Not Currently    Frequency: 7.0 times per week    Types: Marijuana    Comment: currently last smoked over this past weekend    Allergies:  Allergies  Allergen Reactions   Other Anaphylaxis   Shellfish Allergy Anaphylaxis   Apple Juice Itching   Augmentin [Amoxicillin-Pot Clavulanate] Hives    Has patient had a PCN reaction causing immediate rash, facial/tongue/throat swelling, SOB or lightheadedness with hypotension: Unknown Has patient had a PCN reaction causing severe rash involving mucus membranes or skin necrosis: Unknown Has patient had a PCN reaction that required hospitalization: Unknown Has patient had a PCN reaction occurring within the last 10 years: Yes If all of the above answers are "NO", then may proceed with Cephalosporin use.    Banana Itching   Ceftin [Cefuroxime Axetil] Hives    Unknown reaction   Cherry    Peach [Prunus Persica] Itching   Plum Pulp Itching   Watermelon [Citrullus Vulgaris] Itching  No medications prior to admission.    Review of Systems  Constitutional: Negative.  Negative for fatigue and fever.  HENT: Negative.    Respiratory: Negative.  Negative for shortness of breath.   Cardiovascular: Negative.  Negative for chest pain.  Gastrointestinal:  Positive for nausea and vomiting. Negative for abdominal pain, constipation and diarrhea.  Genitourinary: Negative.  Negative for dysuria, vaginal bleeding and vaginal discharge.  Neurological:  Positive for headaches. Negative for dizziness.   Physical Exam   Blood pressure 112/62, pulse 68, temperature 97.6 F (36.4 C), temperature source Oral, resp. rate 18, height 5' 3.5" (1.613 m), weight 91 kg, last menstrual period 09/26/2021, SpO2 99 %, unknown if currently breastfeeding.  Patient Vitals for the past 24 hrs:  BP Temp Temp src Pulse Resp SpO2 Height Weight  12/30/21 1315 112/62 -- -- 68 18 -- -- --  12/30/21 0955 127/71 -- -- 68  -- -- -- --  12/30/21 0927 128/74 97.6 F (36.4 C) Oral 66 20 99 % -- --  12/30/21 0920 -- -- -- -- -- -- 5' 3.5" (1.613 m) 91 kg    Physical Exam Vitals and nursing note reviewed.  Constitutional:      General: She is not in acute distress.    Appearance: She is well-developed.  HENT:     Head: Normocephalic.  Eyes:     Pupils: Pupils are equal, round, and reactive to light.  Cardiovascular:     Rate and Rhythm: Normal rate and regular rhythm.     Heart sounds: Normal heart sounds.  Pulmonary:     Effort: Pulmonary effort is normal. No respiratory distress.     Breath sounds: Normal breath sounds.  Abdominal:     General: Bowel sounds are normal. There is no distension.     Palpations: Abdomen is soft.     Tenderness: There is no abdominal tenderness.  Skin:    General: Skin is warm and dry.  Neurological:     Mental Status: She is alert and oriented to person, place, and time.  Psychiatric:        Mood and Affect: Mood normal.        Behavior: Behavior normal.        Thought Content: Thought content normal.        Judgment: Judgment normal.     FHT: 152 bpm   MAU Course  Procedures  Results for orders placed or performed during the hospital encounter of 12/30/21 (from the past 24 hour(s))  Urinalysis, Routine w reflex microscopic Urine, Clean Catch     Status: Abnormal   Collection Time: 12/30/21  9:55 AM  Result Value Ref Range   Color, Urine AMBER (A) YELLOW   APPearance HAZY (A) CLEAR   Specific Gravity, Urine 1.032 (H) 1.005 - 1.030   pH 5.0 5.0 - 8.0   Glucose, UA NEGATIVE NEGATIVE mg/dL   Hgb urine dipstick SMALL (A) NEGATIVE   Bilirubin Urine NEGATIVE NEGATIVE   Ketones, ur 80 (A) NEGATIVE mg/dL   Protein, ur 30 (A) NEGATIVE mg/dL   Nitrite NEGATIVE NEGATIVE   Leukocytes,Ua NEGATIVE NEGATIVE   RBC / HPF 0-5 0 - 5 RBC/hpf   WBC, UA 0-5 0 - 5 WBC/hpf   Bacteria, UA FEW (A) NONE SEEN   Squamous Epithelial / LPF 0-5 0 - 5   Mucus PRESENT        MDM Prenatal records from community office not on file.  Labs ordered and reviewed.   UA  LR bolus Flexeril PO Zofran ODT  Patient reports resolution of headache and nausea while in MAU. Able to eat breakfast tray she bought in cafeteria before arrival to MAU  Assessment and Plan   1. Nausea and vomiting during pregnancy prior to [redacted] weeks gestation   2. [redacted] weeks gestation of pregnancy     -Discharge home in stable condition -Rx for zofran and refills of inhalers per patient request -Second trimester precautions discussed -Patient advised to follow-up with OB as scheduled for prenatal care -Patient may return to MAU as needed or if her condition were to change or worsen  Rolm Bookbinder, CNM 12/30/2021, 9:58 AM

## 2021-12-30 NOTE — Discharge Instructions (Signed)

## 2021-12-30 NOTE — MAU Note (Signed)
Tracy Crawford is a 21 y.o. at [redacted]w[redacted]d here in MAU reporting: she's having N/V, dizziness, and headaches.  Reports H/A  and dizziness are intermittent.  States currently has a "slight" H/A.  Denies VB. LMP: N/A Onset of complaint: 2 weeks Pain score: 4 Vitals:   12/30/21 0927  BP: 128/74  Pulse: 66  Resp: 20  Temp: 97.6 F (36.4 C)  SpO2: 99%     FHT:152 bpm Lab orders placed from triage:  UA

## 2022-03-04 ENCOUNTER — Inpatient Hospital Stay (HOSPITAL_COMMUNITY)
Admission: AD | Admit: 2022-03-04 | Discharge: 2022-03-04 | Disposition: A | Discharge status: Home / Self Care | Payer: Medicaid Other | Attending: Obstetrics & Gynecology | Admitting: Obstetrics & Gynecology

## 2022-03-04 ENCOUNTER — Other Ambulatory Visit: Payer: Self-pay

## 2022-03-04 ENCOUNTER — Encounter (HOSPITAL_COMMUNITY): Payer: Self-pay | Admitting: Obstetrics & Gynecology

## 2022-03-04 DIAGNOSIS — O26892 Other specified pregnancy related conditions, second trimester: Secondary | ICD-10-CM | POA: Diagnosis not present

## 2022-03-04 DIAGNOSIS — O99512 Diseases of the respiratory system complicating pregnancy, second trimester: Secondary | ICD-10-CM | POA: Diagnosis not present

## 2022-03-04 DIAGNOSIS — R197 Diarrhea, unspecified: Secondary | ICD-10-CM | POA: Insufficient documentation

## 2022-03-04 DIAGNOSIS — O99612 Diseases of the digestive system complicating pregnancy, second trimester: Secondary | ICD-10-CM | POA: Insufficient documentation

## 2022-03-04 DIAGNOSIS — J4540 Moderate persistent asthma, uncomplicated: Secondary | ICD-10-CM

## 2022-03-04 DIAGNOSIS — O212 Late vomiting of pregnancy: Secondary | ICD-10-CM | POA: Insufficient documentation

## 2022-03-04 DIAGNOSIS — O99342 Other mental disorders complicating pregnancy, second trimester: Secondary | ICD-10-CM | POA: Diagnosis not present

## 2022-03-04 DIAGNOSIS — Z3A22 22 weeks gestation of pregnancy: Secondary | ICD-10-CM | POA: Insufficient documentation

## 2022-03-04 DIAGNOSIS — O99322 Drug use complicating pregnancy, second trimester: Secondary | ICD-10-CM | POA: Insufficient documentation

## 2022-03-04 DIAGNOSIS — O10912 Unspecified pre-existing hypertension complicating pregnancy, second trimester: Secondary | ICD-10-CM | POA: Insufficient documentation

## 2022-03-04 DIAGNOSIS — J45909 Unspecified asthma, uncomplicated: Secondary | ICD-10-CM | POA: Diagnosis not present

## 2022-03-04 DIAGNOSIS — R1110 Vomiting, unspecified: Secondary | ICD-10-CM

## 2022-03-04 DIAGNOSIS — O99891 Other specified diseases and conditions complicating pregnancy: Secondary | ICD-10-CM | POA: Diagnosis not present

## 2022-03-04 DIAGNOSIS — O98512 Other viral diseases complicating pregnancy, second trimester: Secondary | ICD-10-CM | POA: Insufficient documentation

## 2022-03-04 LAB — CBC WITH DIFFERENTIAL/PLATELET
Abs Immature Granulocytes: 0.22 10*3/uL — ABNORMAL HIGH (ref 0.00–0.07)
Basophils Absolute: 0.1 10*3/uL (ref 0.0–0.1)
Basophils Relative: 0 %
Eosinophils Absolute: 0.3 10*3/uL (ref 0.0–0.5)
Eosinophils Relative: 2 %
HCT: 33.8 % — ABNORMAL LOW (ref 36.0–46.0)
Hemoglobin: 11.5 g/dL — ABNORMAL LOW (ref 12.0–15.0)
Immature Granulocytes: 1 %
Lymphocytes Relative: 13 %
Lymphs Abs: 2.6 10*3/uL (ref 0.7–4.0)
MCH: 28.3 pg (ref 26.0–34.0)
MCHC: 34 g/dL (ref 30.0–36.0)
MCV: 83 fL (ref 80.0–100.0)
Monocytes Absolute: 0.9 10*3/uL (ref 0.1–1.0)
Monocytes Relative: 5 %
Neutro Abs: 15.2 10*3/uL — ABNORMAL HIGH (ref 1.7–7.7)
Neutrophils Relative %: 79 %
Platelets: 354 10*3/uL (ref 150–400)
RBC: 4.07 MIL/uL (ref 3.87–5.11)
RDW: 13.1 % (ref 11.5–15.5)
WBC: 19.2 10*3/uL — ABNORMAL HIGH (ref 4.0–10.5)
nRBC: 0 % (ref 0.0–0.2)

## 2022-03-04 LAB — COMPREHENSIVE METABOLIC PANEL
ALT: 30 U/L (ref 0–44)
AST: 26 U/L (ref 15–41)
Albumin: 3.5 g/dL (ref 3.5–5.0)
Alkaline Phosphatase: 66 U/L (ref 38–126)
Anion gap: 15 (ref 5–15)
BUN: 7 mg/dL (ref 6–20)
CO2: 20 mmol/L — ABNORMAL LOW (ref 22–32)
Calcium: 9.5 mg/dL (ref 8.9–10.3)
Chloride: 105 mmol/L (ref 98–111)
Creatinine, Ser: 0.59 mg/dL (ref 0.44–1.00)
GFR, Estimated: >60 mL/min (ref >60)
Glucose, Bld: 83 mg/dL (ref 70–99)
Potassium: 3.4 mmol/L — ABNORMAL LOW (ref 3.5–5.1)
Sodium: 140 mmol/L (ref 135–145)
Total Bilirubin: 0.8 mg/dL (ref 0.3–1.2)
Total Protein: 7 g/dL (ref 6.5–8.1)

## 2022-03-04 LAB — URINALYSIS, ROUTINE W REFLEX MICROSCOPIC
Bilirubin Urine: NEGATIVE
Glucose, UA: NEGATIVE mg/dL
Hgb urine dipstick: NEGATIVE
Ketones, ur: 80 mg/dL — AB
Leukocytes,Ua: NEGATIVE
Nitrite: NEGATIVE
Protein, ur: 100 mg/dL — AB
Specific Gravity, Urine: 1.032 — ABNORMAL HIGH (ref 1.005–1.030)
pH: 5 (ref 5.0–8.0)

## 2022-03-04 LAB — LIPASE, BLOOD: Lipase: 27 U/L (ref 11–51)

## 2022-03-04 MED ORDER — FLUTICASONE PROPIONATE HFA 110 MCG/ACT IN AERO: 2.0000 | INHALATION_SPRAY | Freq: Two times a day (BID) | RESPIRATORY_TRACT | 2 refills | Status: DC

## 2022-03-04 MED ORDER — FAMOTIDINE IN NACL 20-0.9 MG/50ML-% IV SOLN
20.0000 mg | Freq: Once | INTRAVENOUS | Status: AC
  Administered 2022-03-04: 20 mg via INTRAVENOUS
  Filled 2022-03-04: qty 50

## 2022-03-04 MED ORDER — IPRATROPIUM BROMIDE 0.02 % IN SOLN
0.5000 mg | Freq: Once | RESPIRATORY_TRACT | Status: AC
  Administered 2022-03-04: 0.5 mg via RESPIRATORY_TRACT
  Filled 2022-03-04: qty 2.5

## 2022-03-04 MED ORDER — ONDANSETRON HCL 8 MG PO TABS: 8.0000 mg | ORAL_TABLET | Freq: Three times a day (TID) | ORAL | 0 refills | Status: AC | PRN

## 2022-03-04 MED ORDER — ALBUTEROL SULFATE (2.5 MG/3ML) 0.083% IN NEBU
2.5000 mg | INHALATION_SOLUTION | RESPIRATORY_TRACT | Status: DC | PRN
Start: 2022-03-04 — End: 2022-03-05
  Administered 2022-03-04: 2.5 mg via RESPIRATORY_TRACT
  Filled 2022-03-04: qty 3

## 2022-03-04 MED ORDER — LACTATED RINGERS IV BOLUS
1000.0000 mL | Freq: Once | INTRAVENOUS | Status: AC
  Administered 2022-03-04: 1000 mL via INTRAVENOUS

## 2022-03-04 MED ORDER — FAMOTIDINE 40 MG PO TABS: 40.0000 mg | ORAL_TABLET | Freq: Every evening | ORAL | 0 refills | Status: DC

## 2022-03-04 MED ORDER — ONDANSETRON HCL 4 MG/2ML IJ SOLN
4.0000 mg | Freq: Once | INTRAMUSCULAR | Status: AC
  Administered 2022-03-04: 4 mg via INTRAVENOUS
  Filled 2022-03-04: qty 2

## 2022-03-04 NOTE — MAU Provider Note (Signed)
MAU Provider Note  History  [id number]  Arrival date and time: 03/04/22 1649   Chief Complaint  Patient presents with   Nausea   Emesis   Diarrhea     HPI Tracy Crawford is a 21 y.o. G2P1001 at [redacted]w[redacted]d by LMP with PMHx notable for HSV, asthma, MDD, placenta previa who presents for vomiting and diarrhea for 6 days.  Patient has symptoms before a birthday party for her daughter 5 days ago.  Patient thought her diarrhea and vomiting symptoms were related to nervousness and stress related to her birthday party, however her symptoms worsened progressively over the remaining 5 days.  In the last 24 hours she has had 6 bowel movements, and has vomited a few times.  She notes she is taken ibuprofen for tooth pain and her abdominal pain but this is not helped. During encounter, pt started having wheezing with respirations. She was on  combo of budesonide/glycopyrrolate/formoterol fumarate before her medicaid stopped covering her meds. She now uses albuterol up to 6 times a day.    Vaginal bleeding: No LOF: No Fetal Movement: Yes Contractions: No     OB History     Gravida  2   Para  1   Term  1   Preterm      AB  0   Living  1      SAB  0   IAB      Ectopic      Multiple      Live Births  1           Past Medical History:  Diagnosis Date   Anxiety    Asthma    non compliant with medication    Food allergy    SHELLFISH, TREE NUTS   HSV (herpes simplex virus) infection    Hypertension    PIH   Miscarriage     Past Surgical History:  Procedure Laterality Date   NO PAST SURGERIES     TOOTH EXTRACTION Bilateral 03/10/2021   Procedure: DENTAL RESTORATION/EXTRACTIONS;  Surgeon: Diona Browner, DMD;  Location: [facility];  Service: Oral Surgery;  Laterality: Bilateral;    Family History  Problem Relation Age of Onset   Asthma Father    Asthma Maternal Aunt    Asthma Paternal Grandmother    Allergic rhinitis Neg Hx    Angioedema Neg Hx     Immunodeficiency Neg Hx    Eczema Neg Hx    Urticaria Neg Hx     Social History   Socioeconomic History   Marital status: Single    Spouse name: Not on file   Number of children: 1   Years of education: Not on file   Highest education level: 12th grade  Occupational History   Not on file  Tobacco Use   Smoking status: Never    Passive exposure: Never   Smokeless tobacco: Never  Vaping Use   Vaping Use: Never used  Substance and Sexual Activity   Alcohol use: No   Drug use: Not Currently    Frequency: 7.0 times per week    Types: Marijuana    Comment: currently last smoked over this past weekend   Sexual activity: Not Currently    Birth control/protection: None  Other Topics Concern   Not on file  Social History Narrative   Not on file   Social Determinants of Health   Financial Resource Strain: Not on file  Food Insecurity: No Food Insecurity (08/24/2019)  Hunger Vital Sign    Worried About Running Out of Food in the Last Year: Never true    Ran Out of Food in the Last Year: Never true  Transportation Needs: No Transportation Needs (08/24/2019)   PRAPARE - Hydrologist (Medical): No    Lack of Transportation (Non-Medical): No  Physical Activity: Not on file  Stress: Not on file  Social Connections: Not on file  Intimate Partner Violence: Not on file    Allergies  Allergen Reactions   Other Anaphylaxis   Shellfish Allergy Anaphylaxis   Apple Juice Itching   Augmentin [Amoxicillin-Pot Clavulanate] Hives    Has patient had a PCN reaction causing immediate rash, facial/tongue/throat swelling, SOB or lightheadedness with hypotension: Unknown Has patient had a PCN reaction causing severe rash involving mucus membranes or skin necrosis: Unknown Has patient had a PCN reaction that required hospitalization: Unknown Has patient had a PCN reaction occurring within the last 10 years: Yes If all of the above answers are "NO", then may proceed  with Cephalosporin use.    Banana Itching   Ceftin [Cefuroxime Axetil] Hives    Unknown reaction   Cherry    Peach [Prunus Persica] Itching   Plum Pulp Itching   Watermelon [Citrullus Vulgaris] Itching    No current facility-administered medications on file prior to encounter.   Current Outpatient Medications on File Prior to Encounter  Medication Sig Dispense Refill   albuterol (PROVENTIL) (2.5 MG/3ML) 0.083% nebulizer solution Take 3 mLs (2.5 mg total) by nebulization every 4 (four) hours as needed for wheezing or shortness of breath (coughing fits). 75 mL 2   Prenatal Vit-Fe Fumarate-FA (PRENATAL MULTIVITAMIN) TABS tablet Take 1 tablet by mouth daily at 12 noon.     promethazine (PHENERGAN) 25 MG suppository Place 25 mg rectally every 6 (six) hours as needed for nausea or vomiting.     albuterol (VENTOLIN HFA) 108 (90 Base) MCG/ACT inhaler Inhale 2 puffs into the lungs every 4 (four) hours as needed for wheezing or shortness of breath (coughing fits). 18 g 2   budesonide (PULMICORT) 0.5 MG/2ML nebulizer solution Take 2 mLs (0.5 mg total) by nebulization in the morning and at bedtime. 120 mL 1   EPINEPHrine 0.3 mg/0.3 mL IJ SOAJ injection Inject 0.3 mg into the muscle as needed for anaphylaxis. 1 each 2   fluticasone (FLONASE) 50 MCG/ACT nasal spray Place 1 spray into both nostrils 2 (two) times daily as needed (nasal congestion). 16 g 5   Fluticasone-Umeclidin-Vilant (TRELEGY ELLIPTA) 200-62.5-25 MCG/ACT AEPB Inhale 1 puff into the lungs daily. Rinse mouth after each use. 60 each 1   hydrOXYzine (ATARAX) 10 MG tablet Take 10-20 mg by mouth every 4 (four) hours as needed.     levocetirizine (XYZAL) 5 MG tablet Take 1 tablet (5 mg total) by mouth every evening. 30 tablet 5   metoCLOPramide (REGLAN) 10 MG tablet Take 1 tablet (10 mg total) by mouth in the morning, at noon, in the evening, and at bedtime. 90 tablet 2   montelukast (SINGULAIR) 10 MG tablet Take 1 tablet (10 mg total) by mouth  at bedtime. 30 tablet 5   Olopatadine HCl 0.2 % SOLN Apply 1 drop to eye daily as needed (itchy/watery eyes). 2.5 mL 5   ondansetron (ZOFRAN-ODT) 8 MG disintegrating tablet Take 1 tablet (8 mg total) by mouth every 8 (eight) hours as needed for nausea or vomiting. 30 tablet 2   [DISCONTINUED] ferrous sulfate 325 (65 FE)  MG tablet Take 1 tablet (325 mg total) by mouth daily with breakfast. 30 tablet 3     Review of Systems  Constitutional:  Negative for appetite change, chills and fever.  HENT:  Negative for congestion, facial swelling and postnasal drip.   Eyes:  Negative for photophobia.  Respiratory:  Negative for cough and shortness of breath.   Cardiovascular:  Negative for chest pain.  Gastrointestinal:  Positive for abdominal pain, diarrhea, nausea and vomiting.  Endocrine: Negative for polyuria.  Genitourinary:  Negative for flank pain and pelvic pain.  Musculoskeletal:  Negative for arthralgias.  Skin:  Negative for rash.  Neurological:  Negative for weakness and light-headedness.  Psychiatric/Behavioral:  Negative for confusion.     Pertinent positives and negative per HPI, all others reviewed and negative  Physical Exam   BP (!) 125/55   Pulse 75   Temp 98 F (36.7 C) (Oral)   Resp 19   Ht 5' 2.5" (1.588 m)   Wt 97.2 kg   LMP 09/26/2021   SpO2 100%   BMI 38.57 kg/m   Patient Vitals for the past 24 hrs:  BP Temp Temp src Pulse Resp SpO2 Height Weight  03/04/22 2104 (!) 125/55 -- -- 75 -- -- -- --  03/04/22 1826 128/65 -- -- 62 -- -- -- --  03/04/22 1721 138/65 98 F (36.7 C) Oral 64 19 100 % -- --  03/04/22 1715 -- -- -- -- -- -- 5' 2.5" (1.588 m) 97.2 kg    Physical Exam Vitals reviewed.  Constitutional:      Appearance: Normal appearance.  HENT:     Head: Normocephalic and atraumatic.     Right Ear: External ear normal.     Left Ear: External ear normal.  Cardiovascular:     Rate and Rhythm: Normal rate and regular rhythm.  Pulmonary:     Effort:  Pulmonary effort is normal.     Breath sounds: Normal breath sounds.  Abdominal:     General: Abdomen is flat.     Palpations: Abdomen is soft.     Tenderness: There is abdominal tenderness in the right upper quadrant and epigastric area.  Skin:    General: Skin is warm.     Capillary Refill: Capillary refill takes less than 2 seconds.  Psychiatric:        Mood and Affect: Mood normal.     Cervical Exam  Not done  Bedside Ultrasound not done  FHT Baseline 140  Labs Results for orders placed or performed during the hospital encounter of 03/04/22 (from the past 24 hour(s))  Urinalysis, Routine w reflex microscopic Urine, Clean Catch     Status: Abnormal   Collection Time: 03/04/22  5:35 PM  Result Value Ref Range   Color, Urine AMBER (A) YELLOW   APPearance HAZY (A) CLEAR   Specific Gravity, Urine 1.032 (H) 1.005 - 1.030   pH 5.0 5.0 - 8.0   Glucose, UA NEGATIVE NEGATIVE mg/dL   Hgb urine dipstick NEGATIVE NEGATIVE   Bilirubin Urine NEGATIVE NEGATIVE   Ketones, ur 80 (A) NEGATIVE mg/dL   Protein, ur 100 (A) NEGATIVE mg/dL   Nitrite NEGATIVE NEGATIVE   Leukocytes,Ua NEGATIVE NEGATIVE   RBC / HPF 0-5 0 - 5 RBC/hpf   WBC, UA 0-5 0 - 5 WBC/hpf   Bacteria, UA RARE (A) NONE SEEN   Squamous Epithelial / LPF 0-5 0 - 5   Mucus PRESENT    Hyaline Casts, UA PRESENT   CBC  with Differential/Platelet     Status: Abnormal   Collection Time: 03/04/22  7:03 PM  Result Value Ref Range   WBC 19.2 (H) 4.0 - 10.5 K/uL   RBC 4.07 3.87 - 5.11 MIL/uL   Hemoglobin 11.5 (L) 12.0 - 15.0 g/dL   HCT 33.8 (L) 36.0 - 46.0 %   MCV 83.0 80.0 - 100.0 fL   MCH 28.3 26.0 - 34.0 pg   MCHC 34.0 30.0 - 36.0 g/dL   RDW 13.1 11.5 - 15.5 %   Platelets 354 150 - 400 K/uL   nRBC 0.0 0.0 - 0.2 %   Neutrophils Relative % 79 %   Neutro Abs 15.2 (H) 1.7 - 7.7 K/uL   Lymphocytes Relative 13 %   Lymphs Abs 2.6 0.7 - 4.0 K/uL   Monocytes Relative 5 %   Monocytes Absolute 0.9 0.1 - 1.0 K/uL   Eosinophils  Relative 2 %   Eosinophils Absolute 0.3 0.0 - 0.5 K/uL   Basophils Relative 0 %   Basophils Absolute 0.1 0.0 - 0.1 K/uL   Immature Granulocytes 1 %   Abs Immature Granulocytes 0.22 (H) 0.00 - 0.07 K/uL  Comprehensive metabolic panel     Status: Abnormal   Collection Time: 03/04/22  7:03 PM  Result Value Ref Range   Sodium 140 135 - 145 mmol/L   Potassium 3.4 (L) 3.5 - 5.1 mmol/L   Chloride 105 98 - 111 mmol/L   CO2 20 (L) 22 - 32 mmol/L   Glucose, Bld 83 70 - 99 mg/dL   BUN 7 6 - 20 mg/dL   Creatinine, Ser 0.59 0.44 - 1.00 mg/dL   Calcium 9.5 8.9 - 10.3 mg/dL   Total Protein 7.0 6.5 - 8.1 g/dL   Albumin 3.5 3.5 - 5.0 g/dL   AST 26 15 - 41 U/L   ALT 30 0 - 44 U/L   Alkaline Phosphatase 66 38 - 126 U/L   Total Bilirubin 0.8 0.3 - 1.2 mg/dL   GFR, Estimated >60 >60 mL/min   Anion gap 15 5 - 15  Lipase, blood     Status: None   Collection Time: 03/04/22  7:03 PM  Result Value Ref Range   Lipase 27 11 - 51 U/L    Imaging No results found.  MAU Course  Procedures Lab Orders         Urinalysis, Routine w reflex microscopic Urine, Clean Catch         CBC with Differential/Platelet         Comprehensive metabolic panel         Lipase, blood    Meds ordered this encounter  Medications   lactated ringers bolus 1,000 mL   famotidine (PEPCID) IVPB 20 mg premix   ondansetron (ZOFRAN) injection 4 mg   albuterol (PROVENTIL) (2.5 MG/3ML) 0.083% nebulizer solution 2.5 mg   ipratropium (ATROVENT) nebulizer solution 0.5 mg   famotidine (PEPCID) 40 MG tablet    Sig: Take 1 tablet (40 mg total) by mouth every evening.    Dispense:  30 tablet    Refill:  0   ondansetron (ZOFRAN) 8 MG tablet    Sig: Take 1 tablet (8 mg total) by mouth every 8 (eight) hours as needed for nausea or vomiting.    Dispense:  30 tablet    Refill:  0   fluticasone (FLOVENT HFA) 110 MCG/ACT inhaler    Sig: Inhale 2 puffs into the lungs 2 (two) times daily.    Dispense:  1 each    Refill:  2   Imaging  Orders  No imaging studies ordered today    MDM moderate  Assessment and Plan  Vomiting and diarrhea [redacted] weeks pregnant Fetal Well being NST as above Discussed patient with RN. NST reviewed.   Patient here for vomiting and diarrhea for the past 6 days.  Right upper quadrant pain and epigastric pain on palpation.  Query GERD, gastroenteritis, gallbladder or liver pathology.  Was unremarkable except for high specific gravity and ketonuria on UA.  Elevated white blood cell count, however wonder if this is hemoconcentration given how dehydrated patient was.  Gave 1 L IVP, Zofran, famotidine with improvement in symptoms.  During the encounter patient had some mild wheezing on expiration.  Gave albuterol and ipratropium nebulizer, with improvement in symptoms.  Additionally, sent home with Zofran, famotidine, Flovent for asthma to take on a daily basis. Gave return and preterm labor precautions.  Follow-up with primary OB.  Dispo: discharged to home in stable condition. Discharge Instructions     Discharge patient   Complete by: As directed    Discharge disposition: 01-Home or Self Care   Discharge patient date: 03/04/2022      Allergies as of 03/04/2022       Reactions   Other Anaphylaxis   Shellfish Allergy Anaphylaxis   Apple Juice Itching   Augmentin [amoxicillin-pot Clavulanate] Hives   Has patient had a PCN reaction causing immediate rash, facial/tongue/throat swelling, SOB or lightheadedness with hypotension: Unknown Has patient had a PCN reaction causing severe rash involving mucus membranes or skin necrosis: Unknown Has patient had a PCN reaction that required hospitalization: Unknown Has patient had a PCN reaction occurring within the last 10 years: Yes If all of the above answers are "NO", then may proceed with Cephalosporin use.   Banana Itching   Ceftin [cefuroxime Axetil] Hives   Unknown reaction   Cherry    Peach [prunus Persica] Itching   Plum Pulp Itching    Watermelon [citrullus Vulgaris] Itching        Medication List     TAKE these medications    albuterol (2.5 MG/3ML) 0.083% nebulizer solution Commonly known as: PROVENTIL Take 3 mLs (2.5 mg total) by nebulization every 4 (four) hours as needed for wheezing or shortness of breath (coughing fits).   albuterol 108 (90 Base) MCG/ACT inhaler Commonly known as: Ventolin HFA Inhale 2 puffs into the lungs every 4 (four) hours as needed for wheezing or shortness of breath (coughing fits).   budesonide 0.5 MG/2ML nebulizer solution Commonly known as: Pulmicort Take 2 mLs (0.5 mg total) by nebulization in the morning and at bedtime.   EPINEPHrine 0.3 mg/0.3 mL Soaj injection Commonly known as: EPI-PEN Inject 0.3 mg into the muscle as needed for anaphylaxis.   famotidine 40 MG tablet Commonly known as: PEPCID Take 1 tablet (40 mg total) by mouth every evening. What changed:  medication strength how much to take when to take this   fluticasone 110 MCG/ACT inhaler Commonly known as: FLOVENT HFA Inhale 2 puffs into the lungs 2 (two) times daily.   fluticasone 50 MCG/ACT nasal spray Commonly known as: FLONASE Place 1 spray into both nostrils 2 (two) times daily as needed (nasal congestion).   hydrOXYzine 10 MG tablet Commonly known as: ATARAX Take 10-20 mg by mouth every 4 (four) hours as needed.   levocetirizine 5 MG tablet Commonly known as: XYZAL Take 1 tablet (5 mg total) by mouth every evening.  metoCLOPramide 10 MG tablet Commonly known as: REGLAN Take 1 tablet (10 mg total) by mouth in the morning, at noon, in the evening, and at bedtime.   montelukast 10 MG tablet Commonly known as: Singulair Take 1 tablet (10 mg total) by mouth at bedtime.   Olopatadine HCl 0.2 % Soln Apply 1 drop to eye daily as needed (itchy/watery eyes).   ondansetron 8 MG disintegrating tablet Commonly known as: ZOFRAN-ODT Take 1 tablet (8 mg total) by mouth every 8 (eight) hours as needed  for nausea or vomiting.   ondansetron 8 MG tablet Commonly known as: Zofran Take 1 tablet (8 mg total) by mouth every 8 (eight) hours as needed for nausea or vomiting.   prenatal multivitamin Tabs tablet Take 1 tablet by mouth daily at 12 noon.   promethazine 25 MG suppository Commonly known as: PHENERGAN Place 25 mg rectally every 6 (six) hours as needed for nausea or vomiting.   Trelegy Ellipta 200-62.5-25 MCG/ACT Aepb Generic drug: Fluticasone-Umeclidin-Vilant Inhale 1 puff into the lungs daily. Rinse mouth after each use.        Shelda Pal, [place] Fellow, Faculty practice [place] for [facility] 03/04/22  9:26 PM

## 2022-03-04 NOTE — Discharge Instructions (Signed)

## 2022-03-04 NOTE — MAU Note (Signed)
Tracy Crawford is a 21 y.o. at [redacted]w[redacted]d here in MAU reporting: she's having N/V/D that began Friday.  Reports unable to keep anything down and hasn't eaten in 2-3 days.  Reports has vomited 3x and had 6 diarrhea stools in last 24 hours.  Denies LOF, reports pink discharge with wiping.  Endorses +FM. LMP: N/A Onset of complaint: Friday Pain score: 6 pelvic Vitals:   03/04/22 1721  BP: 138/65  Pulse: 64  Resp: 19  Temp: 98 F (36.7 C)  SpO2: 100%     FHT:140 bpm Lab orders placed from triage:   UA

## 2022-03-21 ENCOUNTER — Inpatient Hospital Stay (HOSPITAL_COMMUNITY)
Admission: AD | Admit: 2022-03-21 | Discharge: 2022-03-21 | Disposition: A | Discharge status: Home / Self Care | Payer: Medicaid Other | Attending: Obstetrics and Gynecology | Admitting: Obstetrics and Gynecology

## 2022-03-21 ENCOUNTER — Encounter (HOSPITAL_COMMUNITY): Payer: Self-pay | Admitting: Obstetrics and Gynecology

## 2022-03-21 DIAGNOSIS — Z3A25 25 weeks gestation of pregnancy: Secondary | ICD-10-CM | POA: Insufficient documentation

## 2022-03-21 DIAGNOSIS — O479 False labor, unspecified: Secondary | ICD-10-CM | POA: Diagnosis not present

## 2022-03-21 DIAGNOSIS — N930 Postcoital and contact bleeding: Secondary | ICD-10-CM | POA: Diagnosis not present

## 2022-03-21 DIAGNOSIS — O26892 Other specified pregnancy related conditions, second trimester: Secondary | ICD-10-CM | POA: Diagnosis not present

## 2022-03-21 DIAGNOSIS — Z0371 Encounter for suspected problem with amniotic cavity and membrane ruled out: Secondary | ICD-10-CM | POA: Insufficient documentation

## 2022-03-21 HISTORY — DX: Gestational (pregnancy-induced) hypertension without significant proteinuria, unspecified trimester: O13.9

## 2022-03-21 LAB — URINALYSIS, ROUTINE W REFLEX MICROSCOPIC
Bacteria, UA: NONE SEEN
Bilirubin Urine: NEGATIVE
Glucose, UA: NEGATIVE mg/dL
Ketones, ur: NEGATIVE mg/dL
Leukocytes,Ua: NEGATIVE
Nitrite: NEGATIVE
Protein, ur: NEGATIVE mg/dL
Specific Gravity, Urine: 1.025 (ref 1.005–1.030)
pH: 5 (ref 5.0–8.0)

## 2022-03-21 LAB — WET PREP, GENITAL
Clue Cells Wet Prep HPF POC: NONE SEEN
Sperm: NONE SEEN
Trich, Wet Prep: NONE SEEN
WBC, Wet Prep HPF POC: <10 (ref <10)
Yeast Wet Prep HPF POC: NONE SEEN

## 2022-03-21 LAB — AMNISURE RUPTURE OF MEMBRANE (ROM) NOT AT ARMC: Amnisure ROM: NEGATIVE

## 2022-03-21 MED ORDER — LACTATED RINGERS IV BOLUS
1000.0000 mL | Freq: Once | INTRAVENOUS | Status: AC
  Administered 2022-03-21: 1000 mL via INTRAVENOUS

## 2022-03-21 MED ORDER — TERBUTALINE SULFATE 1 MG/ML IJ SOLN
0.2500 mg | Freq: Once | INTRAMUSCULAR | Status: AC
  Administered 2022-03-21: 0.25 mg via SUBCUTANEOUS
  Filled 2022-03-21: qty 1

## 2022-03-21 MED ORDER — NIFEDIPINE 10 MG PO CAPS
10.0000 mg | ORAL_CAPSULE | ORAL | Status: DC | PRN
  Administered 2022-03-21: 10 mg via ORAL
  Filled 2022-03-21: qty 1

## 2022-03-21 NOTE — MAU Note (Signed)
Tracy Crawford is a 21 y.o. at [redacted]w[redacted]d here in MAU reporting: just spotting and cramping.  Started cramping in her back about a wk ago, now going to the front.  Noted spotting when she used the bathroom(pink). Had sex today. . No active bleeding or LOF. +FM.  Onset of complaint: cramping for a wk, spotting today Pain score: mild Vitals:   03/21/22 1419  BP: 129/72  Pulse: 73  Resp: 18  Temp: 98.3 F (36.8 C)  SpO2: 100%     FHT:138 Lab orders placed from triage:  urine

## 2022-03-21 NOTE — MAU Provider Note (Signed)
History     CSN: [id number]  Arrival date and time: 03/21/22 1403   Event Date/Time   First Provider Initiated Contact with Patient 03/21/22 1448      Chief Complaint  Patient presents with   Abdominal Pain   Back Pain   Vaginal Bleeding   HPI Tracy Crawford is a 21 y.o. G2P1001 at [redacted]w[redacted]d who presents to MAU with chief complaints of abdominal pain, low back pain and vaginal spotting. Her abdominal and back pain began about one week ago. Her spotting started earlier today when she used the bathroom after intercourse.  After her initial evaluation patient reported new onset abdominal contractions. She is agreeable to IV fluids, Procardia and Terbutaline if necessary.   Ob hx: Term SVD x 1 in 2019.  Patient receives care with [facility].  OB History     Gravida  2   Para  1   Term  1   Preterm      AB  0   Living  1      SAB  0   IAB      Ectopic      Multiple      Live Births  1           Past Medical History:  Diagnosis Date   Anxiety    Asthma    non compliant with medication    Food allergy    SHELLFISH, TREE NUTS   HSV (herpes simplex virus) infection    Hypertension    PIH   Miscarriage    Pregnancy induced hypertension     Past Surgical History:  Procedure Laterality Date   NO PAST SURGERIES     TOOTH EXTRACTION Bilateral 03/10/2021   Procedure: DENTAL RESTORATION/EXTRACTIONS;  Surgeon: Ocie Doyne, DMD;  Location: [facility];  Service: Oral Surgery;  Laterality: Bilateral;    Family History  Problem Relation Age of Onset   Asthma Father    Asthma Maternal Aunt    Asthma Paternal Grandmother    Allergic rhinitis Neg Hx    Angioedema Neg Hx    Immunodeficiency Neg Hx    Eczema Neg Hx    Urticaria Neg Hx     Social History   Tobacco Use   Smoking status: Never    Passive exposure: Never   Smokeless tobacco: Never  Vaping Use   Vaping Use: Never used  Substance Use Topics   Alcohol use: No   Drug use: Not  Currently    Frequency: 7.0 times per week    Types: Marijuana    Comment: currently last smoked over this past weekend    Allergies:  Allergies  Allergen Reactions   Other Anaphylaxis   Shellfish Allergy Anaphylaxis   Apple Juice Itching   Augmentin [Amoxicillin-Pot Clavulanate] Hives    Has patient had a PCN reaction causing immediate rash, facial/tongue/throat swelling, SOB or lightheadedness with hypotension: Unknown Has patient had a PCN reaction causing severe rash involving mucus membranes or skin necrosis: Unknown Has patient had a PCN reaction that required hospitalization: Unknown Has patient had a PCN reaction occurring within the last 10 years: Yes If all of the above answers are "NO", then may proceed with Cephalosporin use.    Banana Itching   Ceftin [Cefuroxime Axetil] Hives    Unknown reaction   Cherry    Peach [Prunus Persica] Itching   Plum Pulp Itching   Watermelon [Citrullus Vulgaris] Itching    Medications Prior to Admission  Medication  Sig Dispense Refill Last Dose   albuterol (PROVENTIL) (2.5 MG/3ML) 0.083% nebulizer solution Take 3 mLs (2.5 mg total) by nebulization every 4 (four) hours as needed for wheezing or shortness of breath (coughing fits). 75 mL 2 Past Month   albuterol (VENTOLIN HFA) 108 (90 Base) MCG/ACT inhaler Inhale 2 puffs into the lungs every 4 (four) hours as needed for wheezing or shortness of breath (coughing fits). 18 g 2 03/21/2022 at 1300   fluticasone (FLONASE) 50 MCG/ACT nasal spray Place 1 spray into both nostrils 2 (two) times daily as needed (nasal congestion). 16 g 5 Past Month   Prenatal Vit-Fe Fumarate-FA (PRENATAL MULTIVITAMIN) TABS tablet Take 1 tablet by mouth daily at 12 noon.   03/20/2022 at 2100   budesonide (PULMICORT) 0.5 MG/2ML nebulizer solution Take 2 mLs (0.5 mg total) by nebulization in the morning and at bedtime. 120 mL 1 More than a month   EPINEPHrine 0.3 mg/0.3 mL IJ SOAJ injection Inject 0.3 mg into the muscle  as needed for anaphylaxis. 1 each 2    famotidine (PEPCID) 40 MG tablet Take 1 tablet (40 mg total) by mouth every evening. 30 tablet 0 More than a month   fluticasone (FLOVENT HFA) 110 MCG/ACT inhaler Inhale 2 puffs into the lungs 2 (two) times daily. 1 each 2 More than a month   Fluticasone-Umeclidin-Vilant (TRELEGY ELLIPTA) 200-62.5-25 MCG/ACT AEPB Inhale 1 puff into the lungs daily. Rinse mouth after each use. 60 each 1 More than a month   hydrOXYzine (ATARAX) 10 MG tablet Take 10-20 mg by mouth every 4 (four) hours as needed.      levocetirizine (XYZAL) 5 MG tablet Take 1 tablet (5 mg total) by mouth every evening. 30 tablet 5 More than a month   metoCLOPramide (REGLAN) 10 MG tablet Take 1 tablet (10 mg total) by mouth in the morning, at noon, in the evening, and at bedtime. 90 tablet 2 More than a month   montelukast (SINGULAIR) 10 MG tablet Take 1 tablet (10 mg total) by mouth at bedtime. 30 tablet 5 More than a month   Olopatadine HCl 0.2 % SOLN Apply 1 drop to eye daily as needed (itchy/watery eyes). 2.5 mL 5 More than a month   ondansetron (ZOFRAN) 8 MG tablet Take 1 tablet (8 mg total) by mouth every 8 (eight) hours as needed for nausea or vomiting. 30 tablet 0 More than a month   ondansetron (ZOFRAN-ODT) 8 MG disintegrating tablet Take 1 tablet (8 mg total) by mouth every 8 (eight) hours as needed for nausea or vomiting. 30 tablet 2 More than a month   promethazine (PHENERGAN) 25 MG suppository Place 25 mg rectally every 6 (six) hours as needed for nausea or vomiting.       Review of Systems  Gastrointestinal:  Positive for abdominal pain.  Genitourinary:  Positive for vaginal bleeding.  All other systems reviewed and are negative.  Physical Exam   Blood pressure 119/65, pulse 76, temperature 98.3 F (36.8 C), temperature source Oral, resp. rate 18, height 5' 2.5" (1.588 m), weight 100.6 kg, last menstrual period 09/26/2021, SpO2 100 %, unknown if currently  breastfeeding.  Physical Exam Vitals and nursing note reviewed. Exam conducted with a chaperone present.  Constitutional:      Appearance: She is not ill-appearing.  Cardiovascular:     Rate and Rhythm: Normal rate and regular rhythm.  Pulmonary:     Effort: Pulmonary effort is normal.     Breath sounds: Normal breath sounds.  Abdominal:     Palpations: Abdomen is soft.     Tenderness: There is no abdominal tenderness.     Comments: Gravid  Genitourinary:    Comments: Pelvic exam: External genitalia normal, vaginal walls pink and well rugated, cervix visually closed, no lesions noted.   Neurological:     Mental Status: She is alert.     MAU Course  Procedures  MDM --Reactive tracing: baseline 135, mod var, + accels, no decels --Toco: initially q 2-7 minutes then improving to UI following treatments given in MAU --1630: CNM returned to bedside. Patient states she now feels as if she is having contractions. Amenable to Terb --1730: CNM at bedside. Patient with pain score 0/10. Cervix checked with sterile digital exam: Closed/thick/ballotable  Patient Vitals for the past 24 hrs:  BP Temp Temp src Pulse Resp SpO2 Height Weight  03/21/22 1730 137/72 -- -- 94 -- -- -- --  03/21/22 1551 119/65 -- -- 76 -- -- -- --  03/21/22 1448 119/60 -- -- 65 -- -- -- --  03/21/22 1419 129/72 98.3 F (36.8 C) Oral 73 18 100 % 5' 2.5" (1.588 m) 100.6 kg   Results for orders placed or performed during the hospital encounter of 03/21/22 (from the past 24 hour(s))  Urinalysis, Routine w reflex microscopic Urine, Clean Catch     Status: Abnormal   Collection Time: 03/21/22  2:49 PM  Result Value Ref Range   Color, Urine YELLOW YELLOW   APPearance HAZY (A) CLEAR   Specific Gravity, Urine 1.025 1.005 - 1.030   pH 5.0 5.0 - 8.0   Glucose, UA NEGATIVE NEGATIVE mg/dL   Hgb urine dipstick SMALL (A) NEGATIVE   Bilirubin Urine NEGATIVE NEGATIVE   Ketones, ur NEGATIVE NEGATIVE mg/dL   Protein, ur  NEGATIVE NEGATIVE mg/dL   Nitrite NEGATIVE NEGATIVE   Leukocytes,Ua NEGATIVE NEGATIVE   RBC / HPF 0-5 0 - 5 RBC/hpf   WBC, UA 0-5 0 - 5 WBC/hpf   Bacteria, UA NONE SEEN NONE SEEN   Squamous Epithelial / LPF 0-5 0 - 5   Mucus PRESENT   Wet prep, genital     Status: None   Collection Time: 03/21/22  2:49 PM   Specimen: Urine, Clean Catch  Result Value Ref Range   Yeast Wet Prep HPF POC NONE SEEN NONE SEEN   Trich, Wet Prep NONE SEEN NONE SEEN   Clue Cells Wet Prep HPF POC NONE SEEN NONE SEEN   WBC, Wet Prep HPF POC <10 <10   Sperm NONE SEEN   Amnisure rupture of membrane (rom)not at [facility]     Status: None   Collection Time: 03/21/22  3:03 PM  Result Value Ref Range   Amnisure ROM NEGATIVE    Meds ordered this encounter  Medications   lactated ringers bolus 1,000 mL   NIFEdipine (PROCARDIA) capsule 10 mg   terbutaline (BRETHINE) injection 0.25 mg   Assessment and Plan  --21 y.o. G2P1001 at [redacted]w[redacted]d  --Reactive tracing, closed cervix --Intact amniotic sac --Postcoital spotting, Rh POS --Pain score 0/10 prior to discharge --Discharge home in stable condition with preterm labor precautions  Calvert Cantor, MSA, MSN, CNM 03/21/2022, 8:15 PM

## 2022-03-22 LAB — GC/CHLAMYDIA PROBE AMP (~~LOC~~) NOT AT ARMC
Chlamydia: NEGATIVE
Comment: NEGATIVE
Comment: NORMAL
Neisseria Gonorrhea: NEGATIVE

## 2022-04-08 ENCOUNTER — Encounter (HOSPITAL_COMMUNITY): Payer: Self-pay | Admitting: Obstetrics and Gynecology

## 2022-04-08 ENCOUNTER — Inpatient Hospital Stay (HOSPITAL_COMMUNITY)
Admission: AD | Admit: 2022-04-08 | Discharge: 2022-04-08 | Disposition: A | Discharge status: Home / Self Care | Payer: Medicaid Other | Attending: Obstetrics and Gynecology | Admitting: Obstetrics and Gynecology

## 2022-04-08 DIAGNOSIS — J069 Acute upper respiratory infection, unspecified: Secondary | ICD-10-CM | POA: Diagnosis not present

## 2022-04-08 DIAGNOSIS — Z3A27 27 weeks gestation of pregnancy: Secondary | ICD-10-CM | POA: Diagnosis not present

## 2022-04-08 DIAGNOSIS — O368120 Decreased fetal movements, second trimester, not applicable or unspecified: Secondary | ICD-10-CM | POA: Diagnosis not present

## 2022-04-08 DIAGNOSIS — O26899 Other specified pregnancy related conditions, unspecified trimester: Secondary | ICD-10-CM

## 2022-04-08 DIAGNOSIS — O4702 False labor before 37 completed weeks of gestation, second trimester: Secondary | ICD-10-CM | POA: Diagnosis not present

## 2022-04-08 DIAGNOSIS — O26892 Other specified pregnancy related conditions, second trimester: Secondary | ICD-10-CM | POA: Insufficient documentation

## 2022-04-08 DIAGNOSIS — R109 Unspecified abdominal pain: Secondary | ICD-10-CM | POA: Insufficient documentation

## 2022-04-08 DIAGNOSIS — O99512 Diseases of the respiratory system complicating pregnancy, second trimester: Secondary | ICD-10-CM | POA: Diagnosis not present

## 2022-04-08 DIAGNOSIS — R079 Chest pain, unspecified: Secondary | ICD-10-CM | POA: Diagnosis not present

## 2022-04-08 DIAGNOSIS — Z1152 Encounter for screening for COVID-19: Secondary | ICD-10-CM | POA: Diagnosis not present

## 2022-04-08 LAB — COMPREHENSIVE METABOLIC PANEL
ALT: 24 U/L (ref 0–44)
AST: 24 U/L (ref 15–41)
Albumin: 2.8 g/dL — ABNORMAL LOW (ref 3.5–5.0)
Alkaline Phosphatase: 74 U/L (ref 38–126)
Anion gap: 7 (ref 5–15)
BUN: 7 mg/dL (ref 6–20)
CO2: 21 mmol/L — ABNORMAL LOW (ref 22–32)
Calcium: 8.4 mg/dL — ABNORMAL LOW (ref 8.9–10.3)
Chloride: 108 mmol/L (ref 98–111)
Creatinine, Ser: 0.59 mg/dL (ref 0.44–1.00)
GFR, Estimated: >60 mL/min (ref >60)
Glucose, Bld: 101 mg/dL — ABNORMAL HIGH (ref 70–99)
Potassium: 3.3 mmol/L — ABNORMAL LOW (ref 3.5–5.1)
Sodium: 136 mmol/L (ref 135–145)
Total Bilirubin: 0.4 mg/dL (ref 0.3–1.2)
Total Protein: 5.7 g/dL — ABNORMAL LOW (ref 6.5–8.1)

## 2022-04-08 LAB — URINALYSIS, ROUTINE W REFLEX MICROSCOPIC
Bilirubin Urine: NEGATIVE
Glucose, UA: NEGATIVE mg/dL
Hgb urine dipstick: NEGATIVE
Ketones, ur: NEGATIVE mg/dL
Leukocytes,Ua: NEGATIVE
Nitrite: NEGATIVE
Protein, ur: NEGATIVE mg/dL
Specific Gravity, Urine: 1.024 (ref 1.005–1.030)
pH: 6 (ref 5.0–8.0)

## 2022-04-08 LAB — CBC WITH DIFFERENTIAL/PLATELET
Abs Immature Granulocytes: 0.28 10*3/uL — ABNORMAL HIGH (ref 0.00–0.07)
Basophils Absolute: 0.1 10*3/uL (ref 0.0–0.1)
Basophils Relative: 0 %
Eosinophils Absolute: 0.5 10*3/uL (ref 0.0–0.5)
Eosinophils Relative: 3 %
HCT: 30.5 % — ABNORMAL LOW (ref 36.0–46.0)
Hemoglobin: 10.2 g/dL — ABNORMAL LOW (ref 12.0–15.0)
Immature Granulocytes: 2 %
Lymphocytes Relative: 15 %
Lymphs Abs: 2.2 10*3/uL (ref 0.7–4.0)
MCH: 27.7 pg (ref 26.0–34.0)
MCHC: 33.4 g/dL (ref 30.0–36.0)
MCV: 82.9 fL (ref 80.0–100.0)
Monocytes Absolute: 0.8 10*3/uL (ref 0.1–1.0)
Monocytes Relative: 5 %
Neutro Abs: 11 10*3/uL — ABNORMAL HIGH (ref 1.7–7.7)
Neutrophils Relative %: 75 %
Platelets: 323 10*3/uL (ref 150–400)
RBC: 3.68 MIL/uL — ABNORMAL LOW (ref 3.87–5.11)
RDW: 12.8 % (ref 11.5–15.5)
WBC: 14.8 10*3/uL — ABNORMAL HIGH (ref 4.0–10.5)
nRBC: 0 % (ref 0.0–0.2)

## 2022-04-08 LAB — RESP PANEL BY RT-PCR (FLU A&B, COVID) ARPGX2
Influenza A by PCR: NEGATIVE
Influenza B by PCR: NEGATIVE
SARS Coronavirus 2 by RT PCR: NEGATIVE

## 2022-04-08 LAB — WET PREP, GENITAL
Clue Cells Wet Prep HPF POC: NONE SEEN
Sperm: NONE SEEN
Trich, Wet Prep: NONE SEEN
WBC, Wet Prep HPF POC: <10 (ref <10)
Yeast Wet Prep HPF POC: NONE SEEN

## 2022-04-08 MED ORDER — ACETAMINOPHEN 500 MG PO TABS
1000.0000 mg | ORAL_TABLET | Freq: Once | ORAL | Status: AC
  Administered 2022-04-08: 1000 mg via ORAL
  Filled 2022-04-08: qty 2

## 2022-04-08 MED ORDER — GUAIFENESIN ER 600 MG PO TB12
1200.0000 mg | ORAL_TABLET | Freq: Once | ORAL | Status: AC
  Administered 2022-04-08: 1200 mg via ORAL
  Filled 2022-04-08: qty 2

## 2022-04-08 NOTE — MAU Provider Note (Signed)
History     CSN: [id number]  Arrival date and time: 04/08/22 1622   Event Date/Time   First Provider Initiated Contact with Patient 04/08/22 1657      Chief Complaint  Patient presents with   Abdominal Pain   HPI [name] is a 21 y.o. year old G40P1001 female at [redacted]w[redacted]d weeks gestation who presents to MAU reporting irregular contractions every 2-3 days; rated 4/10. She reports back pain that "feels like I need to stretch." She reports increase in thick, clear vaginal discharge that started when the contractions started. She reports DFM this AM; she's felt 2 movements over the past 2 hours. She reports her chest feels "hollow when (she) coughs and a burning chest pain." She also complains of runny nose, sneezing and having to use her inhaler more often than she normally does. Her sx's started 04/06/2022. She reports her boyfriend has been sick. She has not taken any medications for her sx's besides her inhaler. She receives [facility] with [facility].   OB History     Gravida  2   Para  1   Term  1   Preterm      AB  0   Living  1      SAB  0   IAB      Ectopic      Multiple      Live Births  1           Past Medical History:  Diagnosis Date   Anxiety    Asthma    non compliant with medication    Food allergy    SHELLFISH, TREE NUTS   HSV (herpes simplex virus) infection    Hypertension    PIH   Miscarriage    Pregnancy induced hypertension     Past Surgical History:  Procedure Laterality Date   NO PAST SURGERIES     TOOTH EXTRACTION Bilateral 03/10/2021   Procedure: DENTAL RESTORATION/EXTRACTIONS;  Surgeon: Ocie Doyne, DMD;  Location: [facility];  Service: Oral Surgery;  Laterality: Bilateral;    Family History  Problem Relation Age of Onset   Asthma Father    Asthma Maternal Aunt    Asthma Paternal Grandmother    Allergic rhinitis Neg Hx    Angioedema Neg Hx    Immunodeficiency Neg Hx    Eczema Neg Hx     Urticaria Neg Hx     Social History   Tobacco Use   Smoking status: Never    Passive exposure: Never   Smokeless tobacco: Never  Vaping Use   Vaping Use: Never used  Substance Use Topics   Alcohol use: No   Drug use: Not Currently    Frequency: 7.0 times per week    Types: Marijuana    Comment: currently last smoked over this past weekend    Allergies:  Allergies  Allergen Reactions   Other Anaphylaxis   Shellfish Allergy Anaphylaxis   Apple Juice Itching   Augmentin [Amoxicillin-Pot Clavulanate] Hives    Has patient had a PCN reaction causing immediate rash, facial/tongue/throat swelling, SOB or lightheadedness with hypotension: Unknown Has patient had a PCN reaction causing severe rash involving mucus membranes or skin necrosis: Unknown Has patient had a PCN reaction that required hospitalization: Unknown Has patient had a PCN reaction occurring within the last 10 years: Yes If all of the above answers are "NO", then may proceed with Cephalosporin use.    Banana Itching   Ceftin [Cefuroxime  Axetil] Hives    Unknown reaction   Cherry    Peach [Prunus Persica] Itching   Plum Pulp Itching   Watermelon [Citrullus Vulgaris] Itching    Medications Prior to Admission  Medication Sig Dispense Refill Last Dose   albuterol (PROVENTIL) (2.5 MG/3ML) 0.083% nebulizer solution Take 3 mLs (2.5 mg total) by nebulization every 4 (four) hours as needed for wheezing or shortness of breath (coughing fits). 75 mL 2 04/08/2022   aspirin 81 MG chewable tablet Chew 81 mg by mouth daily.      Prenatal Vit-Fe Fumarate-FA (PRENATAL MULTIVITAMIN) TABS tablet Take 1 tablet by mouth daily at 12 noon.   04/08/2022   albuterol (VENTOLIN HFA) 108 (90 Base) MCG/ACT inhaler Inhale 2 puffs into the lungs every 4 (four) hours as needed for wheezing or shortness of breath (coughing fits). 18 g 2    budesonide (PULMICORT) 0.5 MG/2ML nebulizer solution Take 2 mLs (0.5 mg total) by nebulization in the morning  and at bedtime. 120 mL 1    EPINEPHrine 0.3 mg/0.3 mL IJ SOAJ injection Inject 0.3 mg into the muscle as needed for anaphylaxis. 1 each 2    famotidine (PEPCID) 40 MG tablet Take 1 tablet (40 mg total) by mouth every evening. 30 tablet 0    fluticasone (FLONASE) 50 MCG/ACT nasal spray Place 1 spray into both nostrils 2 (two) times daily as needed (nasal congestion). 16 g 5    fluticasone (FLOVENT HFA) 110 MCG/ACT inhaler Inhale 2 puffs into the lungs 2 (two) times daily. 1 each 2    Fluticasone-Umeclidin-Vilant (TRELEGY ELLIPTA) 200-62.5-25 MCG/ACT AEPB Inhale 1 puff into the lungs daily. Rinse mouth after each use. 60 each 1    hydrOXYzine (ATARAX) 10 MG tablet Take 10-20 mg by mouth every 4 (four) hours as needed.      levocetirizine (XYZAL) 5 MG tablet Take 1 tablet (5 mg total) by mouth every evening. 30 tablet 5    metoCLOPramide (REGLAN) 10 MG tablet Take 1 tablet (10 mg total) by mouth in the morning, at noon, in the evening, and at bedtime. 90 tablet 2    montelukast (SINGULAIR) 10 MG tablet Take 1 tablet (10 mg total) by mouth at bedtime. 30 tablet 5    Olopatadine HCl 0.2 % SOLN Apply 1 drop to eye daily as needed (itchy/watery eyes). 2.5 mL 5    ondansetron (ZOFRAN-ODT) 8 MG disintegrating tablet Take 1 tablet (8 mg total) by mouth every 8 (eight) hours as needed for nausea or vomiting. 30 tablet 2    promethazine (PHENERGAN) 25 MG suppository Place 25 mg rectally every 6 (six) hours as needed for nausea or vomiting.       Review of Systems  Constitutional:  Positive for fatigue.  HENT:  Positive for congestion, rhinorrhea and sneezing.   Eyes: Negative.   Respiratory:  Positive for cough and chest tightness.   Cardiovascular: Negative.   Gastrointestinal: Negative.   Endocrine: Negative.   Genitourinary:  Positive for vaginal discharge.  Musculoskeletal: Negative.   Skin: Negative.   Allergic/Immunologic: Negative.   Neurological: Negative.   Hematological: Negative.    Psychiatric/Behavioral: Negative.     Physical Exam   Blood pressure 125/65, pulse 84, temperature 98.5 F (36.9 C), temperature source Oral, resp. rate 20, height 5\' 2"  (1.575 m), weight 103.6 kg, last menstrual period 09/26/2021, SpO2 97 %, unknown if currently breastfeeding.  Physical Exam Vitals and nursing note reviewed. Exam conducted with a chaperone present.  Constitutional:  Appearance: Normal appearance. She is obese.  Cardiovascular:     Rate and Rhythm: Normal rate.  Pulmonary:     Effort: Pulmonary effort is normal.  Abdominal:     Palpations: Abdomen is soft.  Skin:    General: Skin is warm and dry.  Neurological:     Mental Status: She is alert and oriented to person, place, and time.  Psychiatric:        Mood and Affect: Mood normal.        Behavior: Behavior normal.        Thought Content: Thought content normal.        Judgment: Judgment normal.    REACTIVE NST - FHR: 130 bpm / moderate variability / accels present / decels absent / TOCO: UI noted   MAU Course  Procedures  MDM CCUA CBC CMP COVID/Respiratory Panel EKG - Normal>> read by Dr. Simonne Martinet Prep GC/CT -- Results pending  Mucinex 1200 mg po Tylenol 1000 mg po  Results for orders placed or performed during the hospital encounter of 04/08/22 (from the past 24 hour(s))  CBC with Differential/Platelet     Status: Abnormal   Collection Time: 04/08/22  5:03 PM  Result Value Ref Range   WBC 14.8 (H) 4.0 - 10.5 K/uL   RBC 3.68 (L) 3.87 - 5.11 MIL/uL   Hemoglobin 10.2 (L) 12.0 - 15.0 g/dL   HCT 64.3 (L) 32.9 - 51.8 %   MCV 82.9 80.0 - 100.0 fL   MCH 27.7 26.0 - 34.0 pg   MCHC 33.4 30.0 - 36.0 g/dL   RDW 84.1 66.0 - 63.0 %   Platelets 323 150 - 400 K/uL   nRBC 0.0 0.0 - 0.2 %   Neutrophils Relative % 75 %   Neutro Abs 11.0 (H) 1.7 - 7.7 K/uL   Lymphocytes Relative 15 %   Lymphs Abs 2.2 0.7 - 4.0 K/uL   Monocytes Relative 5 %   Monocytes Absolute 0.8 0.1 - 1.0 K/uL   Eosinophils  Relative 3 %   Eosinophils Absolute 0.5 0.0 - 0.5 K/uL   Basophils Relative 0 %   Basophils Absolute 0.1 0.0 - 0.1 K/uL   Immature Granulocytes 2 %   Abs Immature Granulocytes 0.28 (H) 0.00 - 0.07 K/uL  Comprehensive metabolic panel     Status: Abnormal   Collection Time: 04/08/22  5:03 PM  Result Value Ref Range   Sodium 136 135 - 145 mmol/L   Potassium 3.3 (L) 3.5 - 5.1 mmol/L   Chloride 108 98 - 111 mmol/L   CO2 21 (L) 22 - 32 mmol/L   Glucose, Bld 101 (H) 70 - 99 mg/dL   BUN 7 6 - 20 mg/dL   Creatinine, Ser 1.60 0.44 - 1.00 mg/dL   Calcium 8.4 (L) 8.9 - 10.3 mg/dL   Total Protein 5.7 (L) 6.5 - 8.1 g/dL   Albumin 2.8 (L) 3.5 - 5.0 g/dL   AST 24 15 - 41 U/L   ALT 24 0 - 44 U/L   Alkaline Phosphatase 74 38 - 126 U/L   Total Bilirubin 0.4 0.3 - 1.2 mg/dL   GFR, Estimated >10 >93 mL/min   Anion gap 7 5 - 15  Urinalysis, Routine w reflex microscopic Anterior Nasal Swab     Status: None   Collection Time: 04/08/22  5:09 PM  Result Value Ref Range   Color, Urine YELLOW YELLOW   APPearance CLEAR CLEAR   Specific Gravity, Urine 1.024 1.005 - 1.030  pH 6.0 5.0 - 8.0   Glucose, UA NEGATIVE NEGATIVE mg/dL   Hgb urine dipstick NEGATIVE NEGATIVE   Bilirubin Urine NEGATIVE NEGATIVE   Ketones, ur NEGATIVE NEGATIVE mg/dL   Protein, ur NEGATIVE NEGATIVE mg/dL   Nitrite NEGATIVE NEGATIVE   Leukocytes,Ua NEGATIVE NEGATIVE  Resp Panel by RT-PCR (Flu A&B, Covid) Anterior Nasal Swab     Status: None   Collection Time: 04/08/22  5:12 PM   Specimen: Anterior Nasal Swab  Result Value Ref Range   SARS Coronavirus 2 by RT PCR NEGATIVE NEGATIVE   Influenza A by PCR NEGATIVE NEGATIVE   Influenza B by PCR NEGATIVE NEGATIVE  Wet prep, genital     Status: None   Collection Time: 04/08/22  5:12 PM   Specimen: Anterior Nasal Swab  Result Value Ref Range   Yeast Wet Prep HPF POC NONE SEEN NONE SEEN   Trich, Wet Prep NONE SEEN NONE SEEN   Clue Cells Wet Prep HPF POC NONE SEEN NONE SEEN   WBC, Wet  Prep HPF POC <10 <10   Sperm NONE SEEN      Assessment and Plan  1. Upper respiratory tract infection, unspecified type - Information provided on URI   2. Abdominal cramping affecting pregnancy - Advised to return to MAU for > painful contractions in 1 hour  3. [redacted] weeks gestation of pregnancy   - Discharge patient - Keep scheduled appt with [facility] - Patient verbalized an understanding of the plan of care and agrees.    Raelyn Mora, CNM 04/08/2022, 4:57 PM

## 2022-04-08 NOTE — Discharge Instructions (Signed)

## 2022-04-08 NOTE — MAU Note (Signed)
.  Tracy Crawford is a 21 y.o. at [redacted]w[redacted]d here in MAU reporting: irregular ctx for the last 2-3 days that she rates 4/10. She also reports back pain 6/10 that feels like she just needs to stretch. Denies VB or LOF. Does report increase in some clear, thick discharge that started when the ctx started. She also reports DFM since this morning. She has felt 2 fetal movements in the last 2 hours.   After triage patient reports that her chest is hurting, her chest feels "hollow when I cough". She reports that she feels a "deep, burning chest pain" when she coughs or breathes 8/10. She also has a runny nose and she is sneezing. She reports having to use her inhaler more than normal recently. She reports that these symptoms started around Tuesday, but is unsure of the exact time. She reports that she has been around her boyfriend who has also been sick.   LMP: N/A Onset of complaint: On-going Pain score: see note Vitals:   04/08/22 1640  BP: 123/64  Pulse: 77  Resp: 20  Temp: 98.5 F (36.9 C)     FHT:135 Lab orders placed from triage:  UA

## 2022-04-09 LAB — GC/CHLAMYDIA PROBE AMP (~~LOC~~) NOT AT ARMC
Chlamydia: NEGATIVE
Comment: NEGATIVE
Comment: NORMAL
Neisseria Gonorrhea: NEGATIVE

## 2022-05-02 ENCOUNTER — Encounter (HOSPITAL_COMMUNITY): Payer: Self-pay | Admitting: Obstetrics & Gynecology

## 2022-05-02 ENCOUNTER — Inpatient Hospital Stay (HOSPITAL_COMMUNITY)
Admission: AD | Admit: 2022-05-02 | Discharge: 2022-05-02 | Disposition: A | Discharge status: Home / Self Care | Payer: Medicaid Other | Attending: Obstetrics & Gynecology | Admitting: Obstetrics & Gynecology

## 2022-05-02 ENCOUNTER — Other Ambulatory Visit: Payer: Self-pay

## 2022-05-02 DIAGNOSIS — O26893 Other specified pregnancy related conditions, third trimester: Secondary | ICD-10-CM | POA: Diagnosis present

## 2022-05-02 DIAGNOSIS — Z3A31 31 weeks gestation of pregnancy: Secondary | ICD-10-CM

## 2022-05-02 DIAGNOSIS — R030 Elevated blood-pressure reading, without diagnosis of hypertension: Secondary | ICD-10-CM

## 2022-05-02 DIAGNOSIS — M549 Dorsalgia, unspecified: Secondary | ICD-10-CM | POA: Diagnosis present

## 2022-05-02 LAB — URINALYSIS, ROUTINE W REFLEX MICROSCOPIC
Bilirubin Urine: NEGATIVE
Glucose, UA: NEGATIVE mg/dL
Hgb urine dipstick: NEGATIVE
Ketones, ur: 5 mg/dL — AB
Leukocytes,Ua: NEGATIVE
Nitrite: NEGATIVE
Protein, ur: 30 mg/dL — AB
Specific Gravity, Urine: 1.026 (ref 1.005–1.030)
pH: 5 (ref 5.0–8.0)

## 2022-05-02 LAB — COMPREHENSIVE METABOLIC PANEL
ALT: 53 U/L — ABNORMAL HIGH (ref 0–44)
AST: 37 U/L (ref 15–41)
Albumin: 2.9 g/dL — ABNORMAL LOW (ref 3.5–5.0)
Alkaline Phosphatase: 82 U/L (ref 38–126)
Anion gap: 7 (ref 5–15)
BUN: 6 mg/dL (ref 6–20)
CO2: 19 mmol/L — ABNORMAL LOW (ref 22–32)
Calcium: 8.7 mg/dL — ABNORMAL LOW (ref 8.9–10.3)
Chloride: 109 mmol/L (ref 98–111)
Creatinine, Ser: 0.55 mg/dL (ref 0.44–1.00)
GFR, Estimated: >60 mL/min (ref >60)
Glucose, Bld: 100 mg/dL — ABNORMAL HIGH (ref 70–99)
Potassium: 3.4 mmol/L — ABNORMAL LOW (ref 3.5–5.1)
Sodium: 135 mmol/L (ref 135–145)
Total Bilirubin: 0.3 mg/dL (ref 0.3–1.2)
Total Protein: 6 g/dL — ABNORMAL LOW (ref 6.5–8.1)

## 2022-05-02 LAB — CBC WITH DIFFERENTIAL/PLATELET
Abs Immature Granulocytes: 0.29 10*3/uL — ABNORMAL HIGH (ref 0.00–0.07)
Basophils Absolute: 0.1 10*3/uL (ref 0.0–0.1)
Basophils Relative: 0 %
Eosinophils Absolute: 0.5 10*3/uL (ref 0.0–0.5)
Eosinophils Relative: 3 %
HCT: 32.5 % — ABNORMAL LOW (ref 36.0–46.0)
Hemoglobin: 10.5 g/dL — ABNORMAL LOW (ref 12.0–15.0)
Immature Granulocytes: 2 %
Lymphocytes Relative: 16 %
Lymphs Abs: 2.4 10*3/uL (ref 0.7–4.0)
MCH: 26.8 pg (ref 26.0–34.0)
MCHC: 32.3 g/dL (ref 30.0–36.0)
MCV: 82.9 fL (ref 80.0–100.0)
Monocytes Absolute: 0.7 10*3/uL (ref 0.1–1.0)
Monocytes Relative: 5 %
Neutro Abs: 11.2 10*3/uL — ABNORMAL HIGH (ref 1.7–7.7)
Neutrophils Relative %: 74 %
Platelets: 357 10*3/uL (ref 150–400)
RBC: 3.92 MIL/uL (ref 3.87–5.11)
RDW: 12.9 % (ref 11.5–15.5)
WBC: 15.2 10*3/uL — ABNORMAL HIGH (ref 4.0–10.5)
nRBC: 0 % (ref 0.0–0.2)

## 2022-05-02 LAB — PROTEIN / CREATININE RATIO, URINE
Creatinine, Urine: 353 mg/dL
Protein Creatinine Ratio: 0.05 mg/mg{Cre} (ref 0.00–0.15)
Total Protein, Urine: 19 mg/dL

## 2022-05-02 NOTE — MAU Note (Signed)
Called Main Lab to request patient's protein creatinine ratio to be ran. Larita Fife in lab stated he would run the sample now.

## 2022-05-02 NOTE — MAU Note (Signed)
Called Main Lab to request status update on protein creatinine ratio as it has not been placed in process and it has been an hour. Larita Fife stated lab has been backed up and he is running the sample now.

## 2022-05-02 NOTE — MAU Provider Note (Signed)
History     CSN: [id number]  Arrival date and time: 05/02/22 1441   Event Date/Time   First Provider Initiated Contact with Patient 05/02/22 1545      Chief Complaint  Patient presents with   Dizziness   Hypertension   Back Pain   HPI  [name] is a 21 y.o. female G2P1001 @ [redacted]w[redacted]d here in MAU with complaints of elevated BP. She reports checking her BP 3x at home and 2 were normal and one read 140's/80's. She has occasional HA that comes and goes without treatment. She denies changes in her vision. She does report GHTN with previous pregnancy. + fetal movement.  Reports occasional dizziness and is unsure if she is drinking enough water during the day and eating enough meals. She has no chest pain or shortness of breath when the dizziness occurs.   She denies HA at this time.   OB History     Gravida  2   Para  1   Term  1   Preterm      AB  0   Living  1      SAB  0   IAB      Ectopic      Multiple      Live Births  1           Past Medical History:  Diagnosis Date   Anxiety    Asthma    non compliant with medication    Food allergy    SHELLFISH, TREE NUTS   HSV (herpes simplex virus) infection    Hypertension    PIH   Miscarriage    Pregnancy induced hypertension     Past Surgical History:  Procedure Laterality Date   NO PAST SURGERIES     TOOTH EXTRACTION Bilateral 03/10/2021   Procedure: DENTAL RESTORATION/EXTRACTIONS;  Surgeon: Ocie Doyne, DMD;  Location: [facility];  Service: Oral Surgery;  Laterality: Bilateral;    Family History  Problem Relation Age of Onset   Asthma Father    Asthma Maternal Aunt    Asthma Paternal Grandmother    Allergic rhinitis Neg Hx    Angioedema Neg Hx    Immunodeficiency Neg Hx    Eczema Neg Hx    Urticaria Neg Hx     Social History   Tobacco Use   Smoking status: Never    Passive exposure: Never   Smokeless tobacco: Never  Vaping Use   Vaping Use: Never used   Substance Use Topics   Alcohol use: No   Drug use: Not Currently    Frequency: 7.0 times per week    Types: Marijuana    Comment: currently last smoked over this past weekend    Allergies:  Allergies  Allergen Reactions   Other Anaphylaxis   Shellfish Allergy Anaphylaxis   Apple Juice Itching   Augmentin [Amoxicillin-Pot Clavulanate] Hives    Has patient had a PCN reaction causing immediate rash, facial/tongue/throat swelling, SOB or lightheadedness with hypotension: Unknown Has patient had a PCN reaction causing severe rash involving mucus membranes or skin necrosis: Unknown Has patient had a PCN reaction that required hospitalization: Unknown Has patient had a PCN reaction occurring within the last 10 years: Yes If all of the above answers are "NO", then may proceed with Cephalosporin use.    Banana Itching   Ceftin [Cefuroxime Axetil] Hives    Unknown reaction   Cherry    Peach [Prunus Persica] Itching   Plum Pulp  Itching   Watermelon [Citrullus Vulgaris] Itching    Medications Prior to Admission  Medication Sig Dispense Refill Last Dose   albuterol (PROVENTIL) (2.5 MG/3ML) 0.083% nebulizer solution Take 3 mLs (2.5 mg total) by nebulization every 4 (four) hours as needed for wheezing or shortness of breath (coughing fits). 75 mL 2    albuterol (VENTOLIN HFA) 108 (90 Base) MCG/ACT inhaler Inhale 2 puffs into the lungs every 4 (four) hours as needed for wheezing or shortness of breath (coughing fits). 18 g 2    aspirin 81 MG chewable tablet Chew 81 mg by mouth daily.      budesonide (PULMICORT) 0.5 MG/2ML nebulizer solution Take 2 mLs (0.5 mg total) by nebulization in the morning and at bedtime. 120 mL 1    EPINEPHrine 0.3 mg/0.3 mL IJ SOAJ injection Inject 0.3 mg into the muscle as needed for anaphylaxis. 1 each 2    famotidine (PEPCID) 40 MG tablet Take 1 tablet (40 mg total) by mouth every evening. 30 tablet 0    fluticasone (FLONASE) 50 MCG/ACT nasal spray Place 1 spray  into both nostrils 2 (two) times daily as needed (nasal congestion). 16 g 5    fluticasone (FLOVENT HFA) 110 MCG/ACT inhaler Inhale 2 puffs into the lungs 2 (two) times daily. 1 each 2    Fluticasone-Umeclidin-Vilant (TRELEGY ELLIPTA) 200-62.5-25 MCG/ACT AEPB Inhale 1 puff into the lungs daily. Rinse mouth after each use. 60 each 1    hydrOXYzine (ATARAX) 10 MG tablet Take 10-20 mg by mouth every 4 (four) hours as needed.      levocetirizine (XYZAL) 5 MG tablet Take 1 tablet (5 mg total) by mouth every evening. 30 tablet 5    metoCLOPramide (REGLAN) 10 MG tablet Take 1 tablet (10 mg total) by mouth in the morning, at noon, in the evening, and at bedtime. 90 tablet 2    montelukast (SINGULAIR) 10 MG tablet Take 1 tablet (10 mg total) by mouth at bedtime. 30 tablet 5    Olopatadine HCl 0.2 % SOLN Apply 1 drop to eye daily as needed (itchy/watery eyes). 2.5 mL 5    ondansetron (ZOFRAN-ODT) 8 MG disintegrating tablet Take 1 tablet (8 mg total) by mouth every 8 (eight) hours as needed for nausea or vomiting. 30 tablet 2    Prenatal Vit-Fe Fumarate-FA (PRENATAL MULTIVITAMIN) TABS tablet Take 1 tablet by mouth daily at 12 noon.      promethazine (PHENERGAN) 25 MG suppository Place 25 mg rectally every 6 (six) hours as needed for nausea or vomiting.      Results for orders placed or performed during the hospital encounter of 05/02/22 (from the past 48 hour(s))  Urinalysis, Routine w reflex microscopic Urine, Clean Catch     Status: Abnormal   Collection Time: 05/02/22  3:01 PM  Result Value Ref Range   Color, Urine AMBER (A) YELLOW    Comment: BIOCHEMICALS MAY BE AFFECTED BY COLOR   APPearance CLEAR CLEAR   Specific Gravity, Urine 1.026 1.005 - 1.030   pH 5.0 5.0 - 8.0   Glucose, UA NEGATIVE NEGATIVE mg/dL   Hgb urine dipstick NEGATIVE NEGATIVE   Bilirubin Urine NEGATIVE NEGATIVE   Ketones, ur 5 (A) NEGATIVE mg/dL   Protein, ur 30 (A) NEGATIVE mg/dL   Nitrite NEGATIVE NEGATIVE   Leukocytes,Ua  NEGATIVE NEGATIVE   RBC / HPF 0-5 0 - 5 RBC/hpf   WBC, UA 0-5 0 - 5 WBC/hpf   Bacteria, UA RARE (A) NONE SEEN   Squamous  Epithelial / LPF 6-10 0 - 5 /HPF   Mucus PRESENT     Comment: Performed at [facility], [address] [place], [place] [address]  Protein / creatinine ratio, urine     Status: None   Collection Time: 05/02/22  3:38 PM  Result Value Ref Range   Creatinine, Urine 353 mg/dL   Total Protein, Urine 19 mg/dL    Comment: NO NORMAL RANGE ESTABLISHED FOR THIS TEST   Protein Creatinine Ratio 0.05 0.00 - 0.15 mg/mg[Cre]    Comment: Performed at [facility], [address] [place], [place] [address]  CBC with Differential/Platelet     Status: Abnormal   Collection Time: 05/02/22  3:55 PM  Result Value Ref Range   WBC 15.2 (H) 4.0 - 10.5 K/uL   RBC 3.92 3.87 - 5.11 MIL/uL   Hemoglobin 10.5 (L) 12.0 - 15.0 g/dL   HCT 30.832.5 (L) 65.736.0 - 84.646.0 %   MCV 82.9 80.0 - 100.0 fL   MCH 26.8 26.0 - 34.0 pg   MCHC 32.3 30.0 - 36.0 g/dL   RDW 96.212.9 95.211.5 - 84.115.5 %   Platelets 357 150 - 400 K/uL   nRBC 0.0 0.0 - 0.2 %   Neutrophils Relative % 74 %   Neutro Abs 11.2 (H) 1.7 - 7.7 K/uL   Lymphocytes Relative 16 %   Lymphs Abs 2.4 0.7 - 4.0 K/uL   Monocytes Relative 5 %   Monocytes Absolute 0.7 0.1 - 1.0 K/uL   Eosinophils Relative 3 %   Eosinophils Absolute 0.5 0.0 - 0.5 K/uL   Basophils Relative 0 %   Basophils Absolute 0.1 0.0 - 0.1 K/uL   Immature Granulocytes 2 %   Abs Immature Granulocytes 0.29 (H) 0.00 - 0.07 K/uL    Comment: Performed at [facility], [address] [place], [place] [address]  Comprehensive metabolic panel     Status: Abnormal   Collection Time: 05/02/22  3:55 PM  Result Value Ref Range   Sodium 135 135 - 145 mmol/L   Potassium 3.4 (L) 3.5 - 5.1 mmol/L   Chloride 109 98 - 111 mmol/L   CO2 19 (L) 22 - 32 mmol/L   Glucose, Bld 100 (H) 70 - 99 mg/dL    Comment: Glucose reference range applies only to samples taken after fasting for at least  8 hours.   BUN 6 6 - 20 mg/dL   Creatinine, Ser 1.020.55 0.44 - 1.00 mg/dL   Calcium 8.7 (L) 8.9 - 10.3 mg/dL   Total Protein 6.0 (L) 6.5 - 8.1 g/dL   Albumin 2.9 (L) 3.5 - 5.0 g/dL   AST 37 15 - 41 U/L   ALT 53 (H) 0 - 44 U/L   Alkaline Phosphatase 82 38 - 126 U/L   Total Bilirubin 0.3 0.3 - 1.2 mg/dL   GFR, Estimated >72>60 >53>60 mL/min    Comment: (NOTE) Calculated using the CKD-EPI Creatinine Equation (2021)    Anion gap 7 5 - 15    Comment: Performed at [facility], [address] [place], [place] [address]     Review of Systems  Constitutional:  Negative for fever.  Eyes:  Negative for photophobia and visual disturbance.  Neurological:  Negative for headaches.   Physical Exam   Blood pressure 131/71, pulse 98, temperature 97.6 F (36.4 C), temperature source Oral, resp. rate 18, height 5\' 2"  (1.575 m), weight 100.8 kg, last menstrual period 09/26/2021, SpO2 97 %,  unknown if currently breastfeeding.  Patient Vitals for the past 24 hrs:  BP Temp Temp src Pulse Resp SpO2 Height Weight  05/02/22 1750 -- -- -- -- -- 98 % -- --  05/02/22 1746 131/81 -- -- 82 -- -- -- --  05/02/22 1730 123/74 -- -- 77 -- 98 % -- --  05/02/22 1715 136/65 -- -- 82 -- 97 % -- --  05/02/22 1700 123/69 -- -- 84 -- 98 % -- --  05/02/22 1645 130/73 -- -- 88 -- 99 % -- --  05/02/22 1630 134/82 -- -- 92 -- -- -- --  05/02/22 1615 (!) 145/71 -- -- 86 -- 99 % -- --  05/02/22 1600 (!) 143/89 -- -- 94 -- -- -- --  05/02/22 1545 137/77 -- -- 87 -- 99 % -- --  05/02/22 1530 131/71 -- -- 98 -- 97 % -- --  05/02/22 1515 (!) 146/82 -- -- 90 -- 97 % -- --  05/02/22 1451 136/77 97.6 F (36.4 C) Oral 92 18 97 % 5\' 2"  (1.575 m) 100.8 kg     Physical Exam Constitutional:      General: She is not in acute distress.    Appearance: Normal appearance. She is not ill-appearing, toxic-appearing or diaphoretic.  Musculoskeletal:        General: Normal range of motion.  Neurological:     Mental Status: She is  alert and oriented to person, place, and time.     Deep Tendon Reflexes: Reflexes normal.     Comments: Negative clonus   Psychiatric:        Behavior: Behavior normal.    Fetal Tracing: Baseline: 120 bpm Variability: Moderate  Accelerations: 15x15 Decelerations: None  Toco: None  MAU Course  Procedures  MDM  PIH labs collected Discussed elevated AST with Dr. and recommended close follow up in office this week.   Assessment and Plan   A:  1. Elevated BP without diagnosis of hypertension   2. [redacted] weeks gestation of pregnancy      P:  Dc home Return to MAU if symptoms worsen Follow up with OB office early this week for BP check Will need to trend and monitor liver enzymes Encouraged 60-80 ounces of water per day. Pre E precautions reviewed  Sallye Ober, NP 05/02/2022 7:08 PM

## 2022-05-02 NOTE — MAU Note (Signed)
.  Tracy Crawford is a 21 y.o. at [redacted]w[redacted]d here in MAU reporting: ongoing lower back pain, dizzy spells that started 2 weeks ago.  Reports elevated bp at home of 130s/80s.  Denies reg ctx, LOF or vag bleeding.  +FM  Onset of complaint: 2 weeks ago  Pain score: 9 Vitals:   05/02/22 1451  BP: 136/77  Pulse: 92  Resp: 18  Temp: 97.6 F (36.4 C)  SpO2: 97%     FHT:135 Lab orders placed from triage:  ua

## 2022-05-03 NOTE — L&D Delivery Note (Addendum)
Delivery Note At 2:25 AM a viable female was delivered via Vaginal, Spontaneous (Presentation: Left Occiput Anterior).  APGAR: 8, 9; weight  pending. Delivered through tight nuchal cord x 1. Placenta status: Spontaneous, Intact.  Cord: 3 vessels with the following complications: None.  Cord pH: n/a  Anesthesia: Epidural Episiotomy:  none Lacerations:  left labial tear and rt labial abrasion Suture Repair: 3.0 vicryl Est. Blood Loss (mL):  289 835mg cytotec given per rectum and 1g txa given as well.  Mom to postpartum.  Baby to Couplet care / Skin to Skin.  ADelice Lesch2/28/2024, 2:44 AM

## 2022-05-05 ENCOUNTER — Encounter (HOSPITAL_COMMUNITY): Payer: Self-pay | Admitting: Obstetrics and Gynecology

## 2022-05-05 ENCOUNTER — Inpatient Hospital Stay (HOSPITAL_COMMUNITY)
Admission: AD | Admit: 2022-05-05 | Discharge: 2022-05-05 | Disposition: A | Discharge status: Home / Self Care | Payer: Medicaid Other | Attending: Obstetrics and Gynecology | Admitting: Obstetrics and Gynecology

## 2022-05-05 ENCOUNTER — Other Ambulatory Visit: Payer: Self-pay

## 2022-05-05 DIAGNOSIS — Z7951 Long term (current) use of inhaled steroids: Secondary | ICD-10-CM | POA: Insufficient documentation

## 2022-05-05 DIAGNOSIS — J45909 Unspecified asthma, uncomplicated: Secondary | ICD-10-CM | POA: Diagnosis not present

## 2022-05-05 DIAGNOSIS — Z7982 Long term (current) use of aspirin: Secondary | ICD-10-CM | POA: Diagnosis not present

## 2022-05-05 DIAGNOSIS — Z79899 Other long term (current) drug therapy: Secondary | ICD-10-CM | POA: Insufficient documentation

## 2022-05-05 DIAGNOSIS — O99343 Other mental disorders complicating pregnancy, third trimester: Secondary | ICD-10-CM | POA: Diagnosis not present

## 2022-05-05 DIAGNOSIS — J45901 Unspecified asthma with (acute) exacerbation: Secondary | ICD-10-CM | POA: Diagnosis not present

## 2022-05-05 DIAGNOSIS — F419 Anxiety disorder, unspecified: Secondary | ICD-10-CM | POA: Insufficient documentation

## 2022-05-05 DIAGNOSIS — O26893 Other specified pregnancy related conditions, third trimester: Secondary | ICD-10-CM | POA: Diagnosis not present

## 2022-05-05 DIAGNOSIS — N949 Unspecified condition associated with female genital organs and menstrual cycle: Secondary | ICD-10-CM

## 2022-05-05 DIAGNOSIS — O99513 Diseases of the respiratory system complicating pregnancy, third trimester: Secondary | ICD-10-CM | POA: Insufficient documentation

## 2022-05-05 DIAGNOSIS — Z3A31 31 weeks gestation of pregnancy: Secondary | ICD-10-CM | POA: Insufficient documentation

## 2022-05-05 DIAGNOSIS — R102 Pelvic and perineal pain: Secondary | ICD-10-CM | POA: Diagnosis not present

## 2022-05-05 DIAGNOSIS — M549 Dorsalgia, unspecified: Secondary | ICD-10-CM | POA: Insufficient documentation

## 2022-05-05 LAB — POCT FERN TEST: POCT Fern Test: NEGATIVE

## 2022-05-05 MED ORDER — ALBUTEROL SULFATE (2.5 MG/3ML) 0.083% IN NEBU: 2.5000 mg | INHALATION_SOLUTION | RESPIRATORY_TRACT | 2 refills | Status: DC | PRN

## 2022-05-05 MED ORDER — ALBUTEROL SULFATE (2.5 MG/3ML) 0.083% IN NEBU
2.5000 mg | INHALATION_SOLUTION | Freq: Once | RESPIRATORY_TRACT | Status: AC
  Administered 2022-05-05: 2.5 mg via RESPIRATORY_TRACT
  Filled 2022-05-05: qty 3

## 2022-05-05 MED ORDER — FLUTICASONE PROPIONATE 50 MCG/ACT NA SUSP: 1.0000 | Freq: Two times a day (BID) | NASAL | 11 refills | Status: DC | PRN

## 2022-05-05 MED ORDER — LEVOCETIRIZINE DIHYDROCHLORIDE 5 MG PO TABS: 5.0000 mg | ORAL_TABLET | Freq: Every evening | ORAL | 11 refills | Status: DC

## 2022-05-05 MED ORDER — ALBUTEROL SULFATE HFA 108 (90 BASE) MCG/ACT IN AERS: 2.0000 | INHALATION_SPRAY | RESPIRATORY_TRACT | 2 refills | Status: DC | PRN

## 2022-05-05 MED ORDER — MONTELUKAST SODIUM 10 MG PO TABS: 10.0000 mg | ORAL_TABLET | Freq: Every day | ORAL | 11 refills | Status: DC

## 2022-05-05 MED ORDER — FLUTICASONE PROPIONATE HFA 110 MCG/ACT IN AERO
1.0000 | INHALATION_SPRAY | Freq: Once | RESPIRATORY_TRACT | Status: AC
  Administered 2022-05-05: 1 via RESPIRATORY_TRACT
  Filled 2022-05-05: qty 12

## 2022-05-05 MED ORDER — FLUTICASONE PROPIONATE HFA 110 MCG/ACT IN AERO: 2.0000 | INHALATION_SPRAY | Freq: Two times a day (BID) | RESPIRATORY_TRACT | 11 refills | Status: DC

## 2022-05-05 NOTE — MAU Note (Signed)
.  Tracy Crawford is a 22 y.o. at 104w4d here in MAU reporting: she's having lower abdominal and back pain that began 2 weeks ago.  Reports pain constant but intensifies and feels like cramping.  Also reports LOF, clear, that began 2 days ago. States as pregnancy is advancing she's wheezing more, has Hx of asthma but needs Rx for inhaler. LMP: NA Onset of complaint: 2 weeks ago Pain score: 6 abdomen & 8 back Vitals:   05/05/22 1253  BP: 126/76  Pulse: 88  Resp: 19  Temp: 98.5 F (36.9 C)  SpO2: 97%     FHT:138 bpm Lab orders placed from triage:   UA

## 2022-05-05 NOTE — MAU Provider Note (Signed)
History     [id number]  Arrival date and time: 05/05/22 1221    Chief Complaint  Patient presents with   Rupture of Membranes   Abdominal Pain   Back Pain   Asthma     HPI Tracy Crawford is a 22 y.o. at [redacted]w[redacted]d with PMHx notable for asthma, who presents for wheezing and abdominal pain.   Review of outside prenatal records from [facility] (in media tab): none available unfortunately  Patient reports that since the beginning of her third trimester she has been having worsening asthma symptoms She endorses frequent wheezing Has been using her albuterol inhaler every 2-4 hours Normal use for her is 1-2 x/day Does not use a steroid inhaler, did use symbicort in the past  Also having some pelvic pain Constant for the most part Also having back pain No bleeding or large loss of fluid Some BH contractions, but infrequent Endorses normal fetal movement     OB History     Gravida  2   Para  1   Term  1   Preterm      AB  0   Living  1      SAB  0   IAB      Ectopic      Multiple      Live Births  1           Past Medical History:  Diagnosis Date   Anxiety    Asthma    non compliant with medication    Food allergy    SHELLFISH, TREE NUTS   HSV (herpes simplex virus) infection    Hypertension    PIH   Miscarriage    Pregnancy induced hypertension     Past Surgical History:  Procedure Laterality Date   NO PAST SURGERIES     TOOTH EXTRACTION Bilateral 03/10/2021   Procedure: DENTAL RESTORATION/EXTRACTIONS;  Surgeon: Diona Browner, DMD;  Location: [facility];  Service: Oral Surgery;  Laterality: Bilateral;    Family History  Problem Relation Age of Onset   Asthma Father    Asthma Maternal Aunt    Asthma Paternal Grandmother    Allergic rhinitis Neg Hx    Angioedema Neg Hx    Immunodeficiency Neg Hx    Eczema Neg Hx    Urticaria Neg Hx     Social History   Socioeconomic History   Marital status: Single    Spouse name:  Not on file   Number of children: 1   Years of education: Not on file   Highest education level: 12th grade  Occupational History   Not on file  Tobacco Use   Smoking status: Never    Passive exposure: Never   Smokeless tobacco: Never  Vaping Use   Vaping Use: Never used  Substance and Sexual Activity   Alcohol use: No   Drug use: Not Currently    Frequency: 7.0 times per week    Types: Marijuana    Comment: currently last smoked over this past weekend   Sexual activity: Yes    Birth control/protection: None  Other Topics Concern   Not on file  Social History Narrative   Not on file   Social Determinants of Health   Financial Resource Strain: Not on file  Food Insecurity: No Food Insecurity (08/24/2019)   Hunger Vital Sign    Worried About Running Out of Food in the Last Year: Never true    Ran Out of Food in  the Last Year: Never true  Transportation Needs: No Transportation Needs (08/24/2019)   PRAPARE - Administrator, Civil Service (Medical): No    Lack of Transportation (Non-Medical): No  Physical Activity: Not on file  Stress: Not on file  Social Connections: Not on file  Intimate Partner Violence: Not on file    Allergies  Allergen Reactions   Other Anaphylaxis   Shellfish Allergy Anaphylaxis   Apple Juice Itching   Augmentin [Amoxicillin-Pot Clavulanate] Hives    Has patient had a PCN reaction causing immediate rash, facial/tongue/throat swelling, SOB or lightheadedness with hypotension: Unknown Has patient had a PCN reaction causing severe rash involving mucus membranes or skin necrosis: Unknown Has patient had a PCN reaction that required hospitalization: Unknown Has patient had a PCN reaction occurring within the last 10 years: Yes If all of the above answers are "NO", then may proceed with Cephalosporin use.    Banana Itching   Ceftin [Cefuroxime Axetil] Hives    Unknown reaction   Cherry    Peach [Prunus Persica] Itching   Plum Pulp  Itching   Watermelon [Citrullus Vulgaris] Itching    No current facility-administered medications on file prior to encounter.   Current Outpatient Medications on File Prior to Encounter  Medication Sig Dispense Refill   aspirin 81 MG chewable tablet Chew 81 mg by mouth daily.     ondansetron (ZOFRAN-ODT) 8 MG disintegrating tablet Take 1 tablet (8 mg total) by mouth every 8 (eight) hours as needed for nausea or vomiting. 30 tablet 2   Prenatal Vit-Fe Fumarate-FA (PRENATAL MULTIVITAMIN) TABS tablet Take 1 tablet by mouth daily at 12 noon.     promethazine (PHENERGAN) 25 MG suppository Place 25 mg rectally every 6 (six) hours as needed for nausea or vomiting.     budesonide (PULMICORT) 0.5 MG/2ML nebulizer solution Take 2 mLs (0.5 mg total) by nebulization in the morning and at bedtime. (Patient not taking: Reported on 05/05/2022) 120 mL 1   famotidine (PEPCID) 40 MG tablet Take 1 tablet (40 mg total) by mouth every evening. 30 tablet 0   Fluticasone-Umeclidin-Vilant (TRELEGY ELLIPTA) 200-62.5-25 MCG/ACT AEPB Inhale 1 puff into the lungs daily. Rinse mouth after each use. (Patient not taking: Reported on 05/05/2022) 60 each 1   hydrOXYzine (ATARAX) 10 MG tablet Take 10-20 mg by mouth every 4 (four) hours as needed. (Patient not taking: Reported on 05/05/2022)     metoCLOPramide (REGLAN) 10 MG tablet Take 1 tablet (10 mg total) by mouth in the morning, at noon, in the evening, and at bedtime. 90 tablet 2   [DISCONTINUED] ferrous sulfate 325 (65 FE) MG tablet Take 1 tablet (325 mg total) by mouth daily with breakfast. 30 tablet 3     ROS Pertinent positives and negative per HPI, all others reviewed and negative  Physical Exam   BP 138/69   Pulse 93   Temp 98.5 F (36.9 C) (Oral)   Resp 19   Ht 5' 2.5" (1.588 m)   Wt 100.4 kg   LMP 09/26/2021   SpO2 100%   BMI 39.85 kg/m   Patient Vitals for the past 24 hrs:  BP Temp Temp src Pulse Resp SpO2 Height Weight  05/05/22 1400 -- -- -- -- --  100 % -- --  05/05/22 1312 138/69 -- -- 93 -- -- -- --  05/05/22 1253 126/76 98.5 F (36.9 C) Oral 88 19 97 % -- --  05/05/22 1245 -- -- -- -- -- -- 5'  2.5" (1.588 m) 100.4 kg    Physical Exam Vitals reviewed.  Constitutional:      General: She is not in acute distress.    Appearance: She is well-developed. She is not diaphoretic.  Eyes:     General: No scleral icterus. Pulmonary:     Effort: Pulmonary effort is normal. No respiratory distress.     Comments: Mildly increased work of breathing. Decreased air movement at the bases but moderate to good in rest of fields. Faint end expiratory wheezes throughout. No rales. Abdominal:     General: There is no distension.     Palpations: Abdomen is soft.     Tenderness: There is no abdominal tenderness. There is no guarding or rebound.  Skin:    General: Skin is warm and dry.  Neurological:     Mental Status: She is alert.     Coordination: Coordination normal.      Cervical Exam    Bedside Ultrasound Not done  My interpretation: n/a  FHT Baseline 135, moderate variability, +accels, no decels Toco: rare ctx Cat: I  Labs Results for orders placed or performed during the hospital encounter of 05/05/22 (from the past 24 hour(s))  Fern Test     Status: Normal   Collection Time: 05/05/22  1:36 PM  Result Value Ref Range   POCT Fern Test Negative = intact amniotic membranes     Imaging No results found.  MAU Course  Procedures Lab Orders         Fern Test    Meds ordered this encounter  Medications   albuterol (PROVENTIL) (2.5 MG/3ML) 0.083% nebulizer solution 2.5 mg   fluticasone (FLOVENT HFA) 110 MCG/ACT inhaler 1 puff   fluticasone (FLOVENT HFA) 110 MCG/ACT inhaler    Sig: Inhale 2 puffs into the lungs 2 (two) times daily.    Dispense:  1 each    Refill:  11   fluticasone (FLONASE) 50 MCG/ACT nasal spray    Sig: Place 1 spray into both nostrils 2 (two) times daily as needed (nasal congestion).    Dispense:  16  g    Refill:  11   levocetirizine (XYZAL) 5 MG tablet    Sig: Take 1 tablet (5 mg total) by mouth every evening.    Dispense:  30 tablet    Refill:  11   montelukast (SINGULAIR) 10 MG tablet    Sig: Take 1 tablet (10 mg total) by mouth at bedtime.    Dispense:  30 tablet    Refill:  11   albuterol (VENTOLIN HFA) 108 (90 Base) MCG/ACT inhaler    Sig: Inhale 2 puffs into the lungs every 4 (four) hours as needed for wheezing or shortness of breath (coughing fits).    Dispense:  18 g    Refill:  2   albuterol (PROVENTIL) (2.5 MG/3ML) 0.083% nebulizer solution    Sig: Take 3 mLs (2.5 mg total) by nebulization every 4 (four) hours as needed for wheezing or shortness of breath (coughing fits).    Dispense:  75 mL    Refill:  2   Imaging Orders  No imaging studies ordered today    MDM moderate  Assessment and Plan  #Asthma, uncontrolled #[redacted] weeks gestation of pregnancy Excessive use of asthma inhaler. No respiratory distress at present but definitely needs a steroid inhaler. Discussed rationale and mechanism of action, importance of using even when she feels well to avoid flares like she is currently having. Given nebulizer of albuterol and  dose of flovent while here, rx sent to pharamcy for fills of both as well.   #Round ligament pain Does not appear laborous, only rare ctx on toco. Discussed symptoms c/w RLP/advancing gestational age, discussed trial of maternity belt.  #FWB FHT Cat I NST: Reactive   Dispo: discharged to home in stable condition.   Clarnce Flock, MD/MPH 05/05/22 2:03 PM  Allergies as of 05/05/2022       Reactions   Other Anaphylaxis   Shellfish Allergy Anaphylaxis   Apple Juice Itching   Augmentin [amoxicillin-pot Clavulanate] Hives   Has patient had a PCN reaction causing immediate rash, facial/tongue/throat swelling, SOB or lightheadedness with hypotension: Unknown Has patient had a PCN reaction causing severe rash involving mucus membranes or  skin necrosis: Unknown Has patient had a PCN reaction that required hospitalization: Unknown Has patient had a PCN reaction occurring within the last 10 years: Yes If all of the above answers are "NO", then may proceed with Cephalosporin use.   Banana Itching   Ceftin [cefuroxime Axetil] Hives   Unknown reaction   Cherry    Peach [prunus Persica] Itching   Plum Pulp Itching   Watermelon [citrullus Vulgaris] Itching        Medication List     STOP taking these medications    budesonide 0.5 MG/2ML nebulizer solution Commonly known as: Pulmicort   Trelegy Ellipta 200-62.5-25 MCG/ACT Aepb Generic drug: Fluticasone-Umeclidin-Vilant       TAKE these medications    albuterol 108 (90 Base) MCG/ACT inhaler Commonly known as: Ventolin HFA Inhale 2 puffs into the lungs every 4 (four) hours as needed for wheezing or shortness of breath (coughing fits).   albuterol (2.5 MG/3ML) 0.083% nebulizer solution Commonly known as: PROVENTIL Take 3 mLs (2.5 mg total) by nebulization every 4 (four) hours as needed for wheezing or shortness of breath (coughing fits).   aspirin 81 MG chewable tablet Chew 81 mg by mouth daily.   famotidine 40 MG tablet Commonly known as: PEPCID Take 1 tablet (40 mg total) by mouth every evening.   fluticasone 110 MCG/ACT inhaler Commonly known as: FLOVENT HFA Inhale 2 puffs into the lungs 2 (two) times daily.   fluticasone 50 MCG/ACT nasal spray Commonly known as: FLONASE Place 1 spray into both nostrils 2 (two) times daily as needed (nasal congestion).   hydrOXYzine 10 MG tablet Commonly known as: ATARAX Take 10-20 mg by mouth every 4 (four) hours as needed.   levocetirizine 5 MG tablet Commonly known as: XYZAL Take 1 tablet (5 mg total) by mouth every evening.   metoCLOPramide 10 MG tablet Commonly known as: REGLAN Take 1 tablet (10 mg total) by mouth in the morning, at noon, in the evening, and at bedtime.   montelukast 10 MG  tablet Commonly known as: Singulair Take 1 tablet (10 mg total) by mouth at bedtime.   ondansetron 8 MG disintegrating tablet Commonly known as: ZOFRAN-ODT Take 1 tablet (8 mg total) by mouth every 8 (eight) hours as needed for nausea or vomiting.   prenatal multivitamin Tabs tablet Take 1 tablet by mouth daily at 12 noon.   promethazine 25 MG suppository Commonly known as: PHENERGAN Place 25 mg rectally every 6 (six) hours as needed for nausea or vomiting.

## 2022-06-06 ENCOUNTER — Encounter (HOSPITAL_COMMUNITY): Payer: Self-pay | Admitting: Obstetrics & Gynecology

## 2022-06-06 ENCOUNTER — Inpatient Hospital Stay (HOSPITAL_COMMUNITY)
Admission: AD | Admit: 2022-06-06 | Discharge: 2022-06-06 | Disposition: A | Discharge status: Home / Self Care | Payer: Medicaid Other | Attending: Obstetrics & Gynecology | Admitting: Obstetrics & Gynecology

## 2022-06-06 DIAGNOSIS — Z0371 Encounter for suspected problem with amniotic cavity and membrane ruled out: Secondary | ICD-10-CM | POA: Diagnosis not present

## 2022-06-06 DIAGNOSIS — O4693 Antepartum hemorrhage, unspecified, third trimester: Secondary | ICD-10-CM | POA: Diagnosis not present

## 2022-06-06 DIAGNOSIS — O4703 False labor before 37 completed weeks of gestation, third trimester: Secondary | ICD-10-CM | POA: Diagnosis not present

## 2022-06-06 DIAGNOSIS — Z3A36 36 weeks gestation of pregnancy: Secondary | ICD-10-CM | POA: Diagnosis not present

## 2022-06-06 DIAGNOSIS — O368130 Decreased fetal movements, third trimester, not applicable or unspecified: Secondary | ICD-10-CM | POA: Diagnosis present

## 2022-06-06 DIAGNOSIS — Z3689 Encounter for other specified antenatal screening: Secondary | ICD-10-CM

## 2022-06-06 DIAGNOSIS — O10913 Unspecified pre-existing hypertension complicating pregnancy, third trimester: Secondary | ICD-10-CM | POA: Diagnosis not present

## 2022-06-06 LAB — URINALYSIS, ROUTINE W REFLEX MICROSCOPIC
Bacteria, UA: NONE SEEN
Bilirubin Urine: NEGATIVE
Glucose, UA: NEGATIVE mg/dL
Hgb urine dipstick: NEGATIVE
Ketones, ur: NEGATIVE mg/dL
Leukocytes,Ua: NEGATIVE
Nitrite: NEGATIVE
Protein, ur: 30 mg/dL — AB
Specific Gravity, Urine: 1.03 (ref 1.005–1.030)
pH: 6 (ref 5.0–8.0)

## 2022-06-06 LAB — POCT FERN TEST

## 2022-06-06 NOTE — MAU Note (Signed)
..  Tracy Crawford is a 22 y.o. at [redacted]w[redacted]d here in MAU reporting: Around 2300-0000 had intercourse, went to sleep and woke up with with contractions, leaking of fluid, and vaginal bleeding. Has had decreased fetal movement since she went to sleep.   Pain score: 6/10 Vitals:   06/06/22 0220  BP: 131/81  Pulse: 87  Resp: 17  Temp: 98.2 F (36.8 C)  SpO2: 100%     FHT:120

## 2022-06-06 NOTE — MAU Note (Signed)
Pt home with discharge paperwork-stated she will refer to her my chart for information. No cervical change. Pt is not in labor. Reviewed labor precautions. Denies questions or concerns at this time

## 2022-06-06 NOTE — MAU Note (Signed)
Pt back to bed for SVE-pt using breast pump "to stimulate milk production" CNM educated pt and discussed on inducing PTL due to nipple stimulation. Pt and mother voiced understanding.

## 2022-06-06 NOTE — MAU Provider Note (Signed)
History     CSN: [id number]  Arrival date and time: 06/06/22 8786   Event Date/Time   First Provider Initiated Contact with Patient 06/06/22 [phone]      Chief Complaint  Patient presents with   Decreased Fetal Movement   Contractions   Vaginal Bleeding   Rupture of Membranes   HPI Tracy Crawford is a 22 y.o. G2P1001 at [redacted]w[redacted]d who receives care at [facility].  She reports her next appt is Feb 7th.  She presents today for LOF, CTX, VB, and DFM.  She reports she had sexual intercourse around 2330 and noticed blood immediately after that was light red with urination and wiping.  She states it was pink and "little to none" upon arrival.  She reports she also noted a puddle of fluid that was mixed with blood.  She endorses leaking on the way to the hospital and reports it was coming through her pants. She states her contractions started on Friday and got more intense on Saturday.  She reports that currently the contractions remain intense and she rates them a 6/10.  She states she was also experiencing DFM, but has since noted movement.  She further reports she is no longer concerned about that.   OB History     Gravida  2   Para  1   Term  1   Preterm      AB  0   Living  1      SAB  0   IAB      Ectopic      Multiple      Live Births  1           Past Medical History:  Diagnosis Date   Anxiety    Asthma    non compliant with medication    Food allergy    SHELLFISH, TREE NUTS   HSV (herpes simplex virus) infection    Hypertension    PIH   Miscarriage    Pregnancy induced hypertension     Past Surgical History:  Procedure Laterality Date   NO PAST SURGERIES     TOOTH EXTRACTION Bilateral 03/10/2021   Procedure: DENTAL RESTORATION/EXTRACTIONS;  Surgeon: Diona Browner, DMD;  Location: [place];  Service: Oral Surgery;  Laterality: Bilateral;    Family History  Problem Relation Age of Onset   Asthma Father    Asthma Maternal Aunt    Asthma  Paternal Grandmother    Allergic rhinitis Neg Hx    Angioedema Neg Hx    Immunodeficiency Neg Hx    Eczema Neg Hx    Urticaria Neg Hx     Social History   Tobacco Use   Smoking status: Never    Passive exposure: Never   Smokeless tobacco: Never  Vaping Use   Vaping Use: Never used  Substance Use Topics   Alcohol use: No   Drug use: Not Currently    Frequency: 7.0 times per week    Types: Marijuana    Comment: currently last smoked over this past weekend    Allergies:  Allergies  Allergen Reactions   Other Anaphylaxis   Shellfish Allergy Anaphylaxis   Apple Juice Itching   Augmentin [Amoxicillin-Pot Clavulanate] Hives    Has patient had a PCN reaction causing immediate rash, facial/tongue/throat swelling, SOB or lightheadedness with hypotension: Unknown Has patient had a PCN reaction causing severe rash involving mucus membranes or skin necrosis: Unknown Has patient had a PCN reaction that required hospitalization: Unknown  Has patient had a PCN reaction occurring within the last 10 years: Yes If all of the above answers are "NO", then may proceed with Cephalosporin use.    Banana Itching   Ceftin [Cefuroxime Axetil] Hives    Unknown reaction   Cherry    Peach [Prunus Persica] Itching   Plum Pulp Itching   Watermelon [Citrullus Vulgaris] Itching    Medications Prior to Admission  Medication Sig Dispense Refill Last Dose   albuterol (PROVENTIL) (2.5 MG/3ML) 0.083% nebulizer solution Take 3 mLs (2.5 mg total) by nebulization every 4 (four) hours as needed for wheezing or shortness of breath (coughing fits). 75 mL 2    albuterol (VENTOLIN HFA) 108 (90 Base) MCG/ACT inhaler Inhale 2 puffs into the lungs every 4 (four) hours as needed for wheezing or shortness of breath (coughing fits). 18 g 2    aspirin 81 MG chewable tablet Chew 81 mg by mouth daily.      famotidine (PEPCID) 40 MG tablet Take 1 tablet (40 mg total) by mouth every evening. 30 tablet 0    fluticasone  (FLONASE) 50 MCG/ACT nasal spray Place 1 spray into both nostrils 2 (two) times daily as needed (nasal congestion). 16 g 11    fluticasone (FLOVENT HFA) 110 MCG/ACT inhaler Inhale 2 puffs into the lungs 2 (two) times daily. 1 each 11    hydrOXYzine (ATARAX) 10 MG tablet Take 10-20 mg by mouth every 4 (four) hours as needed. (Patient not taking: Reported on 05/05/2022)      levocetirizine (XYZAL) 5 MG tablet Take 1 tablet (5 mg total) by mouth every evening. 30 tablet 11    metoCLOPramide (REGLAN) 10 MG tablet Take 1 tablet (10 mg total) by mouth in the morning, at noon, in the evening, and at bedtime. 90 tablet 2    montelukast (SINGULAIR) 10 MG tablet Take 1 tablet (10 mg total) by mouth at bedtime. 30 tablet 11    ondansetron (ZOFRAN-ODT) 8 MG disintegrating tablet Take 1 tablet (8 mg total) by mouth every 8 (eight) hours as needed for nausea or vomiting. 30 tablet 2    Prenatal Vit-Fe Fumarate-FA (PRENATAL MULTIVITAMIN) TABS tablet Take 1 tablet by mouth daily at 12 noon.      promethazine (PHENERGAN) 25 MG suppository Place 25 mg rectally every 6 (six) hours as needed for nausea or vomiting.       Review of Systems  Gastrointestinal:  Positive for abdominal pain (Ctx). Negative for constipation, diarrhea, nausea and vomiting.  Genitourinary:  Positive for vaginal bleeding. Negative for difficulty urinating, dysuria and vaginal discharge.   Physical Exam   Blood pressure 131/81, pulse 87, temperature 98.2 F (36.8 C), temperature source Oral, resp. rate 17, height 5' 2.5" (1.588 m), weight 106.1 kg, last menstrual period 09/26/2021, SpO2 100 %, unknown if currently breastfeeding.  Physical Exam Vitals reviewed. Exam conducted with a chaperone present.  Constitutional:      Appearance: Normal appearance.  HENT:     Head: Normocephalic and atraumatic.  Eyes:     Conjunctiva/sclera: Conjunctivae normal.  Cardiovascular:     Rate and Rhythm: Normal rate.  Pulmonary:     Effort: Pulmonary  effort is normal. No respiratory distress.  Abdominal:     General: Bowel sounds are normal.  Genitourinary:    Labia:        Right: No tenderness or lesion.        Left: No tenderness or lesion.      Vagina: Vaginal discharge present.  No bleeding.     Cervix: Cervical bleeding present.     Comments: Sterile Speculum Exam: -Normal External Genitalia: Non tender, no apparent discharge at introitus.  -Vaginal Vault: Pink mucosa with good rugae. Small amt bloody mucoid discharge-No pooling.  -Cervix:Pink, no lesions, cysts, or polyps.  Small laceration noted at 2 o'clock that is hemostatic with pressure. No active bleeding from os- -Bimanual Exam: Dilation: 2.5 Effacement (%): Thick Station: Ballotable Presentation: Vertex Exam by:: Gavin Pound CNM Musculoskeletal:        General: Normal range of motion.     Cervical back: Normal range of motion.  Neurological:     Mental Status: She is alert and oriented to person, place, and time.  Psychiatric:        Mood and Affect: Mood normal.        Behavior: Behavior normal.     Fetal Assessment 120 bpm, Mod Var, -Decels, +Accels Toco: Q2-40min  MAU Course   Results for orders placed or performed during the hospital encounter of 06/06/22 (from the past 24 hour(s))  Fern Test     Status: Normal   Collection Time: 06/06/22  3:35 AM  Result Value Ref Range   POCT Fern Test     Negative    Urinalysis, Routine w reflex microscopic -Urine, Clean Catch     Status: Abnormal   Collection Time: 06/06/22  3:50 AM  Result Value Ref Range   Color, Urine YELLOW YELLOW   APPearance CLEAR CLEAR   Specific Gravity, Urine 1.030 1.005 - 1.030   pH 6.0 5.0 - 8.0   Glucose, UA NEGATIVE NEGATIVE mg/dL   Hgb urine dipstick NEGATIVE NEGATIVE   Bilirubin Urine NEGATIVE NEGATIVE   Ketones, ur NEGATIVE NEGATIVE mg/dL   Protein, ur 30 (A) NEGATIVE mg/dL   Nitrite NEGATIVE NEGATIVE   Leukocytes,Ua NEGATIVE NEGATIVE   RBC / HPF 11-20 0 - 5 RBC/hpf    WBC, UA 0-5 0 - 5 WBC/hpf   Bacteria, UA NONE SEEN NONE SEEN   Squamous Epithelial / HPF 0-5 0 - 5 /HPF   Mucus PRESENT    No results found.  MDM PE Labs: Everlean Cherry EFM Education Assessment and Plan  22 year old G2P1001  SIUP at 36.1 weeks Cat I FT R/O LOF Vaginal Bleeding Contractions  -POC Reviewed -Reassured that some spotting, after sexual activity, can be anticipated and of little concern especially if it is self-limiting.  Reviewed when to be concerned. -Reassured of patient report of normal fetal movement.  Patient comments that she thinks infant was sleeping. -Exam performed and findings discussed. -Fern collected and negative. -Patient offered and declines pain medication. -NST reactive. -Monitor and reassess   Maryann Conners MSN, CNM 06/06/2022, 3:06 AM   Reassessment (4:43 AM) -Provider to bedside to repeat cervical exam -Patient using breast pump and sitting on birth ball. -Educated on proper timing of pumping and instructed to discontinue until after 37 weeks d/t potential to cause labor onset.  -Patient mother-Melissa- at bedside, and questions regarding leaking and vaginal bleeding addressed. -Cervical exam performed and remains unchanged.  Bleeding precautions reiterated.  -Encouraged to call primary office or return to MAU if symptoms worsen or with the onset of new symptoms. -Discharged to home in stable condition.  Maryann Conners MSN, CNM Advanced Practice Provider, [facility]

## 2022-06-09 LAB — OB RESULTS CONSOLE GBS: GBS: NEGATIVE

## 2022-06-25 ENCOUNTER — Telehealth (HOSPITAL_COMMUNITY): Payer: Self-pay | Admitting: *Deleted

## 2022-06-25 NOTE — Telephone Encounter (Signed)
Preadmission screen  

## 2022-06-28 ENCOUNTER — Encounter (HOSPITAL_COMMUNITY): Payer: Self-pay | Admitting: *Deleted

## 2022-06-28 NOTE — Telephone Encounter (Signed)
Pt states she has been using a breast pump and storing milk since 37 weeks.  States her doctor's office told her it was OK.  Message left on Dr Alesia Richards voicemail with this information

## 2022-06-29 ENCOUNTER — Encounter (HOSPITAL_COMMUNITY): Payer: Self-pay | Admitting: Obstetrics and Gynecology

## 2022-06-29 ENCOUNTER — Inpatient Hospital Stay (HOSPITAL_COMMUNITY)
Admission: AD | Admit: 2022-06-29 | Discharge: 2022-07-01 | DRG: 806 | Disposition: A | Discharge status: Home / Self Care | Payer: Medicaid Other | Attending: Obstetrics and Gynecology | Admitting: Obstetrics and Gynecology

## 2022-06-29 ENCOUNTER — Other Ambulatory Visit: Payer: Self-pay

## 2022-06-29 DIAGNOSIS — Z87892 Personal history of anaphylaxis: Secondary | ICD-10-CM

## 2022-06-29 DIAGNOSIS — O134 Gestational [pregnancy-induced] hypertension without significant proteinuria, complicating childbirth: Secondary | ICD-10-CM | POA: Diagnosis present

## 2022-06-29 DIAGNOSIS — Z7951 Long term (current) use of inhaled steroids: Secondary | ICD-10-CM | POA: Diagnosis not present

## 2022-06-29 DIAGNOSIS — O9952 Diseases of the respiratory system complicating childbirth: Secondary | ICD-10-CM | POA: Diagnosis present

## 2022-06-29 DIAGNOSIS — O139 Gestational [pregnancy-induced] hypertension without significant proteinuria, unspecified trimester: Secondary | ICD-10-CM | POA: Diagnosis present

## 2022-06-29 DIAGNOSIS — O9902 Anemia complicating childbirth: Secondary | ICD-10-CM | POA: Diagnosis present

## 2022-06-29 DIAGNOSIS — Z880 Allergy status to penicillin: Secondary | ICD-10-CM | POA: Diagnosis not present

## 2022-06-29 DIAGNOSIS — O133 Gestational [pregnancy-induced] hypertension without significant proteinuria, third trimester: Principal | ICD-10-CM

## 2022-06-29 DIAGNOSIS — Z7982 Long term (current) use of aspirin: Secondary | ICD-10-CM | POA: Diagnosis not present

## 2022-06-29 DIAGNOSIS — O9832 Other infections with a predominantly sexual mode of transmission complicating childbirth: Secondary | ICD-10-CM | POA: Diagnosis present

## 2022-06-29 DIAGNOSIS — Z881 Allergy status to other antibiotic agents status: Secondary | ICD-10-CM | POA: Diagnosis not present

## 2022-06-29 DIAGNOSIS — A6000 Herpesviral infection of urogenital system, unspecified: Secondary | ICD-10-CM | POA: Diagnosis present

## 2022-06-29 DIAGNOSIS — Z3A39 39 weeks gestation of pregnancy: Secondary | ICD-10-CM | POA: Diagnosis not present

## 2022-06-29 DIAGNOSIS — O99214 Obesity complicating childbirth: Secondary | ICD-10-CM | POA: Diagnosis present

## 2022-06-29 DIAGNOSIS — J4550 Severe persistent asthma, uncomplicated: Secondary | ICD-10-CM | POA: Diagnosis present

## 2022-06-29 HISTORY — DX: Depression, unspecified: F32.A

## 2022-06-29 LAB — CBC
HCT: 30.6 % — ABNORMAL LOW (ref 36.0–46.0)
Hemoglobin: 9.9 g/dL — ABNORMAL LOW (ref 12.0–15.0)
MCH: 25.6 pg — ABNORMAL LOW (ref 26.0–34.0)
MCHC: 32.4 g/dL (ref 30.0–36.0)
MCV: 79.1 fL — ABNORMAL LOW (ref 80.0–100.0)
Platelets: 320 10*3/uL (ref 150–400)
RBC: 3.87 MIL/uL (ref 3.87–5.11)
RDW: 14.1 % (ref 11.5–15.5)
WBC: 13.3 10*3/uL — ABNORMAL HIGH (ref 4.0–10.5)
nRBC: 0 % (ref 0.0–0.2)

## 2022-06-29 LAB — URINALYSIS, ROUTINE W REFLEX MICROSCOPIC
Glucose, UA: NEGATIVE mg/dL
Ketones, ur: NEGATIVE mg/dL
Nitrite: NEGATIVE
Protein, ur: NEGATIVE mg/dL
Specific Gravity, Urine: >1.03 — ABNORMAL HIGH (ref 1.005–1.030)
pH: 6 (ref 5.0–8.0)

## 2022-06-29 LAB — URINALYSIS, MICROSCOPIC (REFLEX)

## 2022-06-29 LAB — TYPE AND SCREEN
ABO/RH(D): B POS
Antibody Screen: NEGATIVE

## 2022-06-29 LAB — RAPID HIV SCREEN (HIV 1/2 AB+AG)
HIV 1/2 Antibodies: NONREACTIVE
HIV-1 P24 Antigen - HIV24: NONREACTIVE

## 2022-06-29 LAB — COMPREHENSIVE METABOLIC PANEL
ALT: 25 U/L (ref 0–44)
AST: 27 U/L (ref 15–41)
Albumin: 2.5 g/dL — ABNORMAL LOW (ref 3.5–5.0)
Alkaline Phosphatase: 139 U/L — ABNORMAL HIGH (ref 38–126)
Anion gap: 9 (ref 5–15)
BUN: 8 mg/dL (ref 6–20)
CO2: 21 mmol/L — ABNORMAL LOW (ref 22–32)
Calcium: 8.4 mg/dL — ABNORMAL LOW (ref 8.9–10.3)
Chloride: 106 mmol/L (ref 98–111)
Creatinine, Ser: 0.69 mg/dL (ref 0.44–1.00)
GFR, Estimated: >60 mL/min (ref >60)
Glucose, Bld: 81 mg/dL (ref 70–99)
Potassium: 3.8 mmol/L (ref 3.5–5.1)
Sodium: 136 mmol/L (ref 135–145)
Total Bilirubin: 0.4 mg/dL (ref 0.3–1.2)
Total Protein: 5.7 g/dL — ABNORMAL LOW (ref 6.5–8.1)

## 2022-06-29 LAB — PROTEIN / CREATININE RATIO, URINE
Creatinine, Urine: 239 mg/dL
Protein Creatinine Ratio: 0.12 mg/mg{Cre} (ref 0.00–0.15)
Total Protein, Urine: 28 mg/dL

## 2022-06-29 MED ORDER — OXYTOCIN BOLUS FROM INFUSION
333.0000 mL | Freq: Once | INTRAVENOUS | Status: AC
  Administered 2022-06-30: 333 mL via INTRAVENOUS

## 2022-06-29 MED ORDER — DIPHENHYDRAMINE HCL 50 MG/ML IJ SOLN: 12.5000 mg | INTRAMUSCULAR | Status: DC | PRN

## 2022-06-29 MED ORDER — ACETAMINOPHEN-CAFFEINE 500-65 MG PO TABS: 2.0000 | ORAL_TABLET | Freq: Once | ORAL | Status: DC

## 2022-06-29 MED ORDER — OXYCODONE-ACETAMINOPHEN 5-325 MG PO TABS: 2.0000 | ORAL_TABLET | ORAL | Status: DC | PRN

## 2022-06-29 MED ORDER — LACTATED RINGERS IV SOLN
500.0000 mL | Freq: Once | INTRAVENOUS | Status: AC
  Administered 2022-06-30: 500 mL via INTRAVENOUS

## 2022-06-29 MED ORDER — ACETAMINOPHEN 325 MG PO TABS: 650.0000 mg | ORAL_TABLET | ORAL | Status: DC | PRN

## 2022-06-29 MED ORDER — EPHEDRINE 5 MG/ML INJ: 10.0000 mg | INTRAVENOUS | Status: DC | PRN

## 2022-06-29 MED ORDER — PHENYLEPHRINE 80 MCG/ML (10ML) SYRINGE FOR IV PUSH (FOR BLOOD PRESSURE SUPPORT): 80.0000 ug | PREFILLED_SYRINGE | INTRAVENOUS | Status: DC | PRN

## 2022-06-29 MED ORDER — ACETAMINOPHEN-CAFFEINE 500-65 MG PO TABS
2.0000 | ORAL_TABLET | Freq: Once | ORAL | Status: AC
  Administered 2022-06-29: 2 via ORAL
  Filled 2022-06-29: qty 2

## 2022-06-29 MED ORDER — LACTATED RINGERS IV SOLN: INTRAVENOUS | Status: DC

## 2022-06-29 MED ORDER — FAMOTIDINE 20 MG PO TABS: 40.0000 mg | ORAL_TABLET | Freq: Every evening | ORAL | Status: DC

## 2022-06-29 MED ORDER — ONDANSETRON HCL 4 MG/2ML IJ SOLN: 4.0000 mg | Freq: Four times a day (QID) | INTRAMUSCULAR | Status: DC | PRN

## 2022-06-29 MED ORDER — MONTELUKAST SODIUM 10 MG PO TABS
10.0000 mg | ORAL_TABLET | Freq: Every day | ORAL | Status: DC
  Filled 2022-06-29: qty 1

## 2022-06-29 MED ORDER — SOD CITRATE-CITRIC ACID 500-334 MG/5ML PO SOLN: 30.0000 mL | ORAL | Status: DC | PRN

## 2022-06-29 MED ORDER — TERBUTALINE SULFATE 1 MG/ML IJ SOLN
0.2500 mg | Freq: Once | INTRAMUSCULAR | Status: AC | PRN
  Administered 2022-06-30: 0.25 mg via SUBCUTANEOUS
  Filled 2022-06-29: qty 1

## 2022-06-29 MED ORDER — OXYCODONE-ACETAMINOPHEN 5-325 MG PO TABS: 1.0000 | ORAL_TABLET | ORAL | Status: DC | PRN

## 2022-06-29 MED ORDER — FLUTICASONE PROPIONATE 50 MCG/ACT NA SUSP: 1.0000 | Freq: Two times a day (BID) | NASAL | Status: DC | PRN

## 2022-06-29 MED ORDER — LACTATED RINGERS IV SOLN
500.0000 mL | INTRAVENOUS | Status: DC | PRN
  Administered 2022-06-29 – 2022-06-30 (×2): 500 mL via INTRAVENOUS

## 2022-06-29 MED ORDER — LIDOCAINE HCL (PF) 1 % IJ SOLN
30.0000 mL | INTRAMUSCULAR | Status: AC | PRN
  Administered 2022-06-30: 30 mL via SUBCUTANEOUS
  Filled 2022-06-29: qty 30

## 2022-06-29 MED ORDER — ALBUTEROL SULFATE (2.5 MG/3ML) 0.083% IN NEBU: 3.0000 mL | INHALATION_SOLUTION | RESPIRATORY_TRACT | Status: DC | PRN

## 2022-06-29 MED ORDER — FENTANYL CITRATE (PF) 100 MCG/2ML IJ SOLN
50.0000 ug | INTRAMUSCULAR | Status: DC | PRN
  Administered 2022-06-29 (×2): 100 ug via INTRAVENOUS
  Filled 2022-06-29 (×2): qty 2

## 2022-06-29 MED ORDER — HYDROXYZINE HCL 50 MG PO TABS: 50.0000 mg | ORAL_TABLET | Freq: Four times a day (QID) | ORAL | Status: DC | PRN

## 2022-06-29 MED ORDER — OXYTOCIN-SODIUM CHLORIDE 30-0.9 UT/500ML-% IV SOLN
2.5000 [IU]/h | INTRAVENOUS | Status: DC
  Filled 2022-06-29: qty 500

## 2022-06-29 MED ORDER — FENTANYL-BUPIVACAINE-NACL 0.5-0.125-0.9 MG/250ML-% EP SOLN
12.0000 mL/h | EPIDURAL | Status: DC | PRN
  Administered 2022-06-30: 12 mL/h via EPIDURAL
  Filled 2022-06-29: qty 250

## 2022-06-29 MED ORDER — OXYTOCIN-SODIUM CHLORIDE 30-0.9 UT/500ML-% IV SOLN
1.0000 m[IU]/min | INTRAVENOUS | Status: DC
  Administered 2022-06-29: 2 m[IU]/min via INTRAVENOUS

## 2022-06-29 NOTE — H&P (Signed)
Tracy Crawford is a 22 y.o. female, G2P1001, IUP at 39.3 weeks, presenting for IOL for GHTN, elevtaed BP in office today and here in MAU, PCR and labs unremarkable, at 0.12. Asymptomatic. Pt endorse + Fm. Denies vaginal leakage. Denies vaginal bleeding. Denies feeling cxt's.   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 no lesion or prodromal s/sx.  Vitamin D deficiency: Noted at NOB, rx'd supplement Bipolar disorder/OCD/MDD with SI Attempt:  sees Natalie at [facility] not currently on medication. Intentional drug overdose 05/2017. Asthma: Takes pro air Maternal obesity complicating pregnancy, childbirth and the puerperium, antepartum: BMI 40, antenatal testing from 36 weeks, follow growth. Asymptomatic bacteriuria in pregnancy:  25-50K enterococcus at 31 weeks, defer tx unless sx, TOC neg. Anemia of pregnancy: Hgb 10.2 at 28 weeks, rx'd Fe.  10.8 at 31 weeks, continue Fe. Heart murmur: As child, not heard on NOB exam History of attempted suicide  History of severe pre-eclampsia: 2019, induction.  PIH labs/PCR at NOB, recommend ASA. History of multiple allergies: Augmentin, Ceftin, shellfish, several fruits  Patient Active Problem List   Diagnosis Date Noted   Gestational hypertension 06/29/2022   Severe persistent asthma with (acute) exacerbation 08/24/2021   Second hand tobacco smoke exposure 08/24/2021   Suicide attempt ([place]) 01/19/2018   Herpes genitalis in women 01/19/2018   MDD (major depressive disorder), severe ([place]) 05/11/2017   Anaphylactic reaction due to food, subsequent encounter 11/20/2015     Active Ambulatory Problems    Diagnosis Date Noted   Anaphylactic reaction due to food, subsequent encounter 11/20/2015   MDD (major depressive disorder), severe ([place]) 05/11/2017   Suicide attempt ([place]) 01/19/2018   Herpes genitalis in women 01/19/2018   Severe persistent asthma with (acute)  exacerbation 08/24/2021   Second hand tobacco smoke exposure 08/24/2021   Resolved Ambulatory Problems    Diagnosis Date Noted   Moderate persistent asthma during pregnancy 11/20/2015   Allergic rhinitis 11/20/2015   Non compliance w medication regimen 11/20/2015   Eosinophils increased 11/20/2015   Non-seasonal allergic rhinitis due to pollen 08/19/2016   Seafood allergy, anaphylaxis, subsequent encounter 08/19/2016   Overdose 05/11/2017   Pregnant and not yet delivered in third trimester 06/28/2017   Bacterial vaginitis 06/28/2017   Morning sickness 08/07/2017   Hyperemesis affecting pregnancy, antepartum 08/20/2017   Preterm uterine contractions 01/19/2018   Allergic conjunctivitis 01/24/2018   Mild preeclampsia, third trimester 02/25/2018   Severe pre-eclampsia 02/26/2018   Vaginal delivery 02/26/2018   Anemia in pregnancy 02/27/2018   Other allergic rhinitis 01/29/2020   Allergic conjunctivitis of both eyes 01/29/2020   Oral allergy syndrome, subsequent encounter 08/24/2021   Left wrist pain 11/01/2018   Closed fracture of distal end of radius 11/02/2018   Past Medical History:  Diagnosis Date   Anxiety    Asthma    Depression    Food allergy    HSV (herpes simplex virus) infection    Hypertension    Miscarriage    PONV (postoperative nausea and vomiting)    Pregnancy induced hypertension       Medications Prior to Admission  Medication Sig Dispense Refill Last Dose   albuterol (PROVENTIL) (2.5 MG/3ML) 0.083% nebulizer solution Take 3 mLs (2.5 mg total) by nebulization every 4 (four) hours as needed for wheezing or shortness of breath (coughing fits). 75 mL 2 06/29/2022   albuterol (VENTOLIN HFA) 108 (90 Base) MCG/ACT inhaler Inhale 2 puffs into the lungs  every 4 (four) hours as needed for wheezing or shortness of breath (coughing fits). 18 g 2 06/29/2022   aspirin 81 MG chewable tablet Chew 81 mg by mouth daily.   06/29/2022   Prenatal Vit-Fe Fumarate-FA (PRENATAL  MULTIVITAMIN) TABS tablet Take 1 tablet by mouth daily at 12 noon.   06/29/2022   famotidine (PEPCID) 40 MG tablet Take 1 tablet (40 mg total) by mouth every evening. 30 tablet 0    fluticasone (FLONASE) 50 MCG/ACT nasal spray Place 1 spray into both nostrils 2 (two) times daily as needed (nasal congestion). 16 g 11 More than a month   fluticasone (FLOVENT HFA) 110 MCG/ACT inhaler Inhale 2 puffs into the lungs 2 (two) times daily. 1 each 11 More than a month   levocetirizine (XYZAL) 5 MG tablet Take 1 tablet (5 mg total) by mouth every evening. 30 tablet 11 More than a month   metoCLOPramide (REGLAN) 10 MG tablet Take 1 tablet (10 mg total) by mouth in the morning, at noon, in the evening, and at bedtime. 90 tablet 2 More than a month   montelukast (SINGULAIR) 10 MG tablet Take 1 tablet (10 mg total) by mouth at bedtime. 30 tablet 11 More than a month   ondansetron (ZOFRAN-ODT) 8 MG disintegrating tablet Take 1 tablet (8 mg total) by mouth every 8 (eight) hours as needed for nausea or vomiting. 30 tablet 2 More than a month   promethazine (PHENERGAN) 25 MG suppository Place 25 mg rectally every 6 (six) hours as needed for nausea or vomiting.   More than a month    Past Medical History:  Diagnosis Date   Anxiety    Asthma    non compliant with medication    Depression    Food allergy    SHELLFISH, TREE NUTS   HSV (herpes simplex virus) infection    Hypertension    PIH   Miscarriage    PONV (postoperative nausea and vomiting)    Pregnancy induced hypertension      No current facility-administered medications on file prior to encounter.   Current Outpatient Medications on File Prior to Encounter  Medication Sig Dispense Refill   albuterol (PROVENTIL) (2.5 MG/3ML) 0.083% nebulizer solution Take 3 mLs (2.5 mg total) by nebulization every 4 (four) hours as needed for wheezing or shortness of breath (coughing fits). 75 mL 2   albuterol (VENTOLIN HFA) 108 (90 Base) MCG/ACT inhaler Inhale 2  puffs into the lungs every 4 (four) hours as needed for wheezing or shortness of breath (coughing fits). 18 g 2   aspirin 81 MG chewable tablet Chew 81 mg by mouth daily.     Prenatal Vit-Fe Fumarate-FA (PRENATAL MULTIVITAMIN) TABS tablet Take 1 tablet by mouth daily at 12 noon.     famotidine (PEPCID) 40 MG tablet Take 1 tablet (40 mg total) by mouth every evening. 30 tablet 0   fluticasone (FLONASE) 50 MCG/ACT nasal spray Place 1 spray into both nostrils 2 (two) times daily as needed (nasal congestion). 16 g 11   fluticasone (FLOVENT HFA) 110 MCG/ACT inhaler Inhale 2 puffs into the lungs 2 (two) times daily. 1 each 11   levocetirizine (XYZAL) 5 MG tablet Take 1 tablet (5 mg total) by mouth every evening. 30 tablet 11   metoCLOPramide (REGLAN) 10 MG tablet Take 1 tablet (10 mg total) by mouth in the morning, at noon, in the evening, and at bedtime. 90 tablet 2   montelukast (SINGULAIR) 10 MG tablet Take 1 tablet (10  mg total) by mouth at bedtime. 30 tablet 11   ondansetron (ZOFRAN-ODT) 8 MG disintegrating tablet Take 1 tablet (8 mg total) by mouth every 8 (eight) hours as needed for nausea or vomiting. 30 tablet 2   promethazine (PHENERGAN) 25 MG suppository Place 25 mg rectally every 6 (six) hours as needed for nausea or vomiting.     [DISCONTINUED] ferrous sulfate 325 (65 FE) MG tablet Take 1 tablet (325 mg total) by mouth daily with breakfast. 30 tablet 3     Allergies  Allergen Reactions   Other Anaphylaxis   Shellfish Allergy Anaphylaxis   Apple Juice Itching   Augmentin [Amoxicillin-Pot Clavulanate] Hives    Has patient had a PCN reaction causing immediate rash, facial/tongue/throat swelling, SOB or lightheadedness with hypotension: Unknown Has patient had a PCN reaction causing severe rash involving mucus membranes or skin necrosis: Unknown Has patient had a PCN reaction that required hospitalization: Unknown Has patient had a PCN reaction occurring within the last 10 years: Yes If  all of the above answers are "NO", then may proceed with Cephalosporin use.    Banana Itching   Ceftin [Cefuroxime Axetil] Hives    Unknown reaction   Cherry    Peach [Prunus Persica] Itching   Plum Pulp Itching   Watermelon [Citrullus Vulgaris] Itching    History of present pregnancy: Pt Info/Preference:  Screening/Consents:  Labs:   EDD: Estimated Date of Delivery: 07/03/22  Establised: Patient's last menstrual period was 09/26/2021.  Anatomy Scan: Date: 02/13/2023 Placenta Location: posterior Genetic Screen: Panoroma:LR AFP:  First Tri: Quad:  Office: [facility]            First PNV: 9.3 weeks Blood Type --/--/B POS (02/27 1334)  Language: english Last PNV: 39.3 weeks Rhogam    Flu Vaccine:  declined   Antibody NEG (02/27 1334)  TDaP vaccine declined   GTT: Early: 5.3 Third Trimester: 101  Feeding Plan: Br/bt BTL: no Rubella: Immune (08/01 0000)  Contraception: ??? VBAC: no RPR: Nonreactive (08/01 0000)   Circumcision: ???   HBsAg: Negative (08/01 0000)  Pediatrician:  ???   HIV: NON REACTIVE (02/27 1335)   Prenatal Classes: no Additional [place]: 2/20 38.3 weeks BPP 8/8  2/07 EFW 7.0lbs, 75%, vxt, afi wnl, BPP 8/8, posterior placenta GBS: Negative/-- (02/07 0000)(For PCN allergy, check sensitivities)       Chlamydia: neg    MFM Referral/Consult:  GC: neg  Support Person: partner   PAP: ???  Pain Management: Epidural Neonatologist Referral:  Hgb Electrophoresis:  AA  Birth Plan: [place]   Hgb NOB: 12.3    28W: 11.4   OB History     Gravida  2   Para  1   Term  1   Preterm      AB  0   Living  1      SAB  0   IAB      Ectopic      Multiple      Live Births  1          Past Medical History:  Diagnosis Date   Anxiety    Asthma    non compliant with medication    Depression    Food allergy    SHELLFISH, TREE NUTS   HSV (herpes simplex virus) infection    Hypertension    PIH   Miscarriage    PONV (postoperative nausea and vomiting)    Pregnancy induced  hypertension    Past Surgical History:  Procedure Laterality Date   NO PAST SURGERIES     TOOTH EXTRACTION Bilateral 03/10/2021   Procedure: DENTAL RESTORATION/EXTRACTIONS;  Surgeon: Diona Browner, DMD;  Location: [facility];  Service: Oral Surgery;  Laterality: Bilateral;   Family History: family history includes Asthma in her father, maternal aunt, and paternal grandmother; Hypertension in her maternal grandmother and maternal uncle; Obesity in her maternal grandmother. Social History:  reports that she has never smoked. She has never been exposed to tobacco smoke. She has never used smokeless tobacco. She reports that she does not currently use drugs after having used the following drugs: Marijuana. She reports that she does not drink alcohol.   Prenatal Transfer Tool  Maternal Diabetes: No Genetic Screening: Normal Maternal Ultrasounds/Referrals: Normal Fetal Ultrasounds or other Referrals:  None Maternal Substance Abuse:  No Significant Maternal Medications:  Nonevaltrex Significant Maternal Lab Results: Group B Strep negative and Other: HSV+  ROS:  Review of Systems  Constitutional: Negative.   HENT: Negative.    Eyes: Negative.   Respiratory: Negative.    Cardiovascular: Negative.   Gastrointestinal:  Positive for abdominal pain.  Genitourinary: Negative.   Musculoskeletal: Negative.   Skin: Negative.   Neurological: Negative.   Endo/Heme/Allergies: Negative.   Psychiatric/Behavioral: Negative.       Physical Exam: BP (!) 152/99   Pulse 71   Temp 97.6 F (36.4 C) (Oral)   Resp 16   Ht 5' 2.5" (1.588 m)   Wt 114.3 kg   LMP 09/26/2021   SpO2 100%   BMI 45.36 kg/m   Physical Exam Vitals and nursing note reviewed.  Constitutional:      Appearance: She is well-developed and normal weight.  HENT:     Head: Normocephalic and atraumatic.     Mouth/Throat:     Mouth: Mucous membranes are moist.  Eyes:     Extraocular Movements: Extraocular  movements intact.  Cardiovascular:     Rate and Rhythm: Normal rate and regular rhythm.  Pulmonary:     Effort: Pulmonary effort is normal.     Breath sounds: Normal breath sounds.  Abdominal:     General: Bowel sounds are normal.     Palpations: Abdomen is soft.  Genitourinary:    General: Normal vulva.     Vagina: Normal.     Cervix: Normal and dilated.     Uterus: Normal.   Musculoskeletal:        General: Normal range of motion.     Cervical back: Normal range of motion.  Skin:    General: Skin is warm.     Capillary Refill: Capillary refill takes less than 2 seconds.  Neurological:     Mental Status: She is alert and oriented to person, place, and time.  Psychiatric:        Mood and Affect: Mood normal.      NST: FHR baseline 130 bpm, Variability: moderate, Accelerations:present, Decelerations:  Absent= Cat 1/Reactive UC:  occ currently, but earlier 3-7 SVE:   Dilation: 4 Effacement (%): 60 Station: -2 Exam by:: [facility], CNM, vertex verified by fetal sutures.  Leopold's: Position vertex, EFW 8lbs via leopold's.  Pelvis proven to 7.10lbs AROM, clear moderate amount, tolerated well.   Labs: Results for orders placed or performed during the hospital encounter of 06/29/22 (from the past 24 hour(s))  Protein / creatinine ratio, urine     Status: None   Collection Time: 06/29/22  1:08 PM  Result Value Ref Range   Creatinine, Urine  239 mg/dL   Total Protein, Urine 28 mg/dL   Protein Creatinine Ratio 0.12 0.00 - 0.15 mg/mg[Cre]  Urinalysis, Routine w reflex microscopic -Urine, Clean Catch     Status: Abnormal   Collection Time: 06/29/22  1:08 PM  Result Value Ref Range   Color, Urine AMBER (A) YELLOW   APPearance CLEAR CLEAR   Specific Gravity, Urine >1.030 (H) 1.005 - 1.030   pH 6.0 5.0 - 8.0   Glucose, UA NEGATIVE NEGATIVE mg/dL   Hgb urine dipstick SMALL (A) NEGATIVE   Bilirubin Urine SMALL (A) NEGATIVE   Ketones, ur NEGATIVE NEGATIVE mg/dL   Protein, ur  NEGATIVE NEGATIVE mg/dL   Nitrite NEGATIVE NEGATIVE   Leukocytes,Ua TRACE (A) NEGATIVE  Urinalysis, Microscopic (reflex)     Status: Abnormal   Collection Time: 06/29/22  1:08 PM  Result Value Ref Range   RBC / HPF 0-5 0 - 5 RBC/hpf   WBC, UA 0-5 0 - 5 WBC/hpf   Bacteria, UA RARE (A) NONE SEEN   Squamous Epithelial / HPF 0-5 0 - 5 /HPF   Mucus PRESENT   Type and screen [facility]     Status: None   Collection Time: 06/29/22  1:34 PM  Result Value Ref Range   ABO/RH(D) B POS    Antibody Screen NEG    Sample Expiration      07/02/2022,2359 Performed at [facility], [place] [address] [place], [place] [address]   Comprehensive metabolic panel     Status: Abnormal   Collection Time: 06/29/22  1:35 PM  Result Value Ref Range   Sodium 136 135 - 145 mmol/L   Potassium 3.8 3.5 - 5.1 mmol/L   Chloride 106 98 - 111 mmol/L   CO2 21 (L) 22 - 32 mmol/L   Glucose, Bld 81 70 - 99 mg/dL   BUN 8 6 - 20 mg/dL   Creatinine, Ser 0.69 0.44 - 1.00 mg/dL   Calcium 8.4 (L) 8.9 - 10.3 mg/dL   Total Protein 5.7 (L) 6.5 - 8.1 g/dL   Albumin 2.5 (L) 3.5 - 5.0 g/dL   AST 27 15 - 41 U/L   ALT 25 0 - 44 U/L   Alkaline Phosphatase 139 (H) 38 - 126 U/L   Total Bilirubin 0.4 0.3 - 1.2 mg/dL   GFR, Estimated >60 >60 mL/min   Anion gap 9 5 - 15  CBC     Status: Abnormal   Collection Time: 06/29/22  1:35 PM  Result Value Ref Range   WBC 13.3 (H) 4.0 - 10.5 K/uL   RBC 3.87 3.87 - 5.11 MIL/uL   Hemoglobin 9.9 (L) 12.0 - 15.0 g/dL   HCT 30.6 (L) 36.0 - 46.0 %   MCV 79.1 (L) 80.0 - 100.0 fL   MCH 25.6 (L) 26.0 - 34.0 pg   MCHC 32.4 30.0 - 36.0 g/dL   RDW 14.1 11.5 - 15.5 %   Platelets 320 150 - 400 K/uL   nRBC 0.0 0.0 - 0.2 %  Rapid HIV screen (HIV 1/2 Ab+Ag)     Status: None   Collection Time: 06/29/22  1:35 PM  Result Value Ref Range   HIV-1 P24 Antigen - HIV24 NON REACTIVE NON REACTIVE   HIV 1/2 Antibodies NON REACTIVE NON REACTIVE   Interpretation (HIV Ag Ab)      A non  reactive test result means that HIV 1 or HIV 2 antibodies and HIV 1 p24 antigen were not detected in the  specimen.    Imaging:  No results found.  MAU Course: Orders Placed This Encounter  Procedures   RPR   Comprehensive metabolic panel   CBC   Rapid HIV screen (HIV 1/2 Ab+Ag)   Protein / creatinine ratio, urine   Urinalysis, Routine w reflex microscopic -Urine, Clean Catch   Urinalysis, Microscopic (reflex)   Diet clear liquid Room service appropriate? Yes; Fluid consistency: Thin   Vitals signs per unit policy   Notify physician (specify)   Fetal monitoring per unit policy   Activity as tolerated   Cervical Exam   Measure blood pressure post delivery every 15 min x 1 hour then every 30 min x 1 hour   Fundal check post delivery every 15 min x 1 hour then every 30 min x 1 hour   Apply Labor & Delivery Care Plan   If Rapid HIV test positive or known HIV positive: initiate AZT orders   May in and out cath x 2 for inability to void   Insert urethral catheter X 1 PRN If Coude Catheter is chosen, qualified resources by campus can be found in the clinical skills nursing procedure for Coude Catheter 1. If straight catheterized > 2 times or patient unable to void post epidural plac...   Refer to Sidebar Report Urinary (Foley) Catheter Indications   Refer to [place] Indwelling Urinary Catheter Removal and Intervention Guidelines   Discontinue foley prior to vaginal delivery   Initiate Carrier Fluid Protocol   Initiate Oral Care Protocol   Patient may have epidural placement upon request   Evaluate fetal heart rate to establish reassuring pattern prior to initiating Cytotec or Pitocin   Perform a cervical exam prior to initiating Cytotec or Pitocin   Discontinue Pitocin if tachysystole with non-reassuring FHR is present   Notify physician (specify) Tachysystole is defined as more than 5 contractions in a 10-minute time period averaged over a 30-minute window   Initiate  intrauterine resuscitation if tachysystole with non-reassuring FHR is present   Notify physician (specify) Tachysystole is defined as more than 5 contractions in a 10-minute time period averaged over a 30-minute window   May administer Terbutaline 0.25 mg SQ x 1 dose if tachysystole with non-reassuring FHR is present   Labor Induction   Full code   Nitrous Oxide 50%/Oxygen 50%   Type and screen [place] and maintain IV Line   Admit to Inpatient (patient's expected length of stay will be greater than 2 midnights or inpatient only procedure)   Meds ordered this encounter  Medications   DISCONTD: acetaminophen-caffeine (EXCEDRIN TENSION HEADACHE) 500-65 MG per tablet 2 tablet   acetaminophen-caffeine (EXCEDRIN TENSION HEADACHE) 500-65 MG per tablet 2 tablet   lactated ringers infusion   oxytocin (PITOCIN) IV BOLUS FROM BAG   oxytocin (PITOCIN) IV infusion 30 units in NS 500 mL - Premix   lactated ringers infusion 500-1,000 mL   acetaminophen (TYLENOL) tablet 650 mg   oxyCODONE-acetaminophen (PERCOCET/ROXICET) 5-325 MG per tablet 1 tablet   oxyCODONE-acetaminophen (PERCOCET/ROXICET) 5-325 MG per tablet 2 tablet   ondansetron (ZOFRAN) injection 4 mg   sodium citrate-citric acid (ORACIT) solution 30 mL   lidocaine (PF) (XYLOCAINE) 1 % injection 30 mL   fentaNYL (SUBLIMAZE) injection 50-100 mcg   hydrOXYzine (ATARAX) tablet 50 mg   terbutaline (BRETHINE) injection 0.25 mg   oxytocin (PITOCIN) IV infusion 30 units in NS 500 mL - Premix    Order Specific Question:   Begin  infusion at:    Answer:   2 milli-units/min (2 mL/hr)    Order Specific Question:   Increase infusion by:    Answer:   2 milli-units/min (2 mL/hr)    Assessment/Plan: Tracy Crawford is a 22 y.o. female, G2P1001, IUP at 39.3 weeks, presenting for IOL for GHTN, elevtaed BP in office today and here in MAU, PCR and labs unremarkable, at 0.12. Asymptomatic. Pt endorse + Fm. Denies vaginal leakage.  Denies vaginal bleeding. Denies feeling cxt's.   Prenatal Problem: Genital herpes simplex: Type 1 and 2 positive by titer 11/08/17--plan Valtrex at 36 weeks.no lesion or prodromal s/sx.  Vitamin D deficiency: Noted at NOB, rx'd supplement Bipolar disorder/OCD/MDD with SI Attempt:  sees Natalie at [facility] not currently on medication. Intentional drug overdose 05/2017. Asthma: Takes pro air Maternal obesity complicating pregnancy, childbirth and the puerperium, antepartum: BMI 40, antenatal testing from 36 weeks, follow growth. Asymptomatic bacteriuria in pregnancy:  25-50K enterococcus at 31 weeks, defer tx unless sx, TOC neg. Anemia of pregnancy: Hgb 10.2 at 28 weeks, rx'd Fe.  10.8 at 31 weeks, continue Fe. Heart murmur: As child, not heard on NOB exam History of attempted suicide  History of severe pre-eclampsia: 2019, induction.  PIH labs/PCR at NOB, recommend ASA. History of multiple allergies: Augmentin, Ceftin, shellfish, several fruits  FWB: Cat 1 Fetal Tracing.   Plan: Admit to [place] per consult with Dr Mancel Bale Routine [facility] orders Pain med/epidural prn Pitocin 2x2 per protocol.  GHTN: monitor BP.  Anticipate labor progression  AROM completed.  Monitor Mood [address], FNP-C, PMHNP-BC  [place] [address] [place], [place] [address]  Cell: [phone]  Office Phone: [phone] Fax: [phone] 06/29/2022  5:25 PM

## 2022-06-29 NOTE — MAU Provider Note (Signed)
History     CSN: [id number]  Arrival date and time: 06/29/22 1225   Event Date/Time   First Provider Initiated Contact with Patient 06/29/22 1257      Chief Complaint  Patient presents with   Headache   Hypertension   HPI Patient is 22 y.o. G2P1001 1w3dhere with complaints of elevated BP in clinic. Has BP of 134/89 and 127/91. Patient has a history of PEC. Has an IOL on 3/2 scheduled. Patient reports mild HA, did eat but not a full breakfast today. She is eating and drinking normally. She reports swelling in pannus. Checked in the office and was 1.5/thick/-2 and had a reactive NST.  +FM, denies LOF, VB, contractions, vaginal discharge.   OB History     Gravida  2   Para  1   Term  1   Preterm      AB  0   Living  1      SAB  0   IAB      Ectopic      Multiple      Live Births  1           Past Medical History:  Diagnosis Date   Anxiety    Asthma    non compliant with medication    Food allergy    SHELLFISH, TREE NUTS   HSV (herpes simplex virus) infection    Hypertension    PIH   Miscarriage    PONV (postoperative nausea and vomiting)    Pregnancy induced hypertension     Past Surgical History:  Procedure Laterality Date   NO PAST SURGERIES     TOOTH EXTRACTION Bilateral 03/10/2021   Procedure: DENTAL RESTORATION/EXTRACTIONS;  Surgeon: JDiona Browner DMD;  [facility]  Service: Oral Surgery;  Laterality: Bilateral;    Family History  Problem Relation Age of Onset   Asthma Father    Asthma Maternal Aunt    Asthma Paternal Grandmother    Allergic rhinitis Neg Hx    Angioedema Neg Hx    Immunodeficiency Neg Hx    Eczema Neg Hx    Urticaria Neg Hx     Social History   Tobacco Use   Smoking status: Never    Passive exposure: Never   Smokeless tobacco: Never  Vaping Use   Vaping Use: Never used  Substance Use Topics   Alcohol use: No   Drug use: Not Currently    Frequency: 7.0 times per week     Types: Marijuana    Comment: currently last smoked over this past weekend    Allergies:  Allergies  Allergen Reactions   Other Anaphylaxis   Shellfish Allergy Anaphylaxis   Apple Juice Itching   Augmentin [Amoxicillin-Pot Clavulanate] Hives    Has patient had a PCN reaction causing immediate rash, facial/tongue/throat swelling, SOB or lightheadedness with hypotension: Unknown Has patient had a PCN reaction causing severe rash involving mucus membranes or skin necrosis: Unknown Has patient had a PCN reaction that required hospitalization: Unknown Has patient had a PCN reaction occurring within the last 10 years: Yes If all of the above answers are "NO", then may proceed with Cephalosporin use.    Banana Itching   Ceftin [Cefuroxime Axetil] Hives    Unknown reaction   Cherry    Peach [Prunus Persica] Itching   Plum Pulp Itching   Watermelon [Citrullus Vulgaris] Itching    Medications Prior to Admission  Medication Sig Dispense Refill Last Dose  albuterol (PROVENTIL) (2.5 MG/3ML) 0.083% nebulizer solution Take 3 mLs (2.5 mg total) by nebulization every 4 (four) hours as needed for wheezing or shortness of breath (coughing fits). 75 mL 2 06/29/2022   albuterol (VENTOLIN HFA) 108 (90 Base) MCG/ACT inhaler Inhale 2 puffs into the lungs every 4 (four) hours as needed for wheezing or shortness of breath (coughing fits). 18 g 2 06/29/2022   aspirin 81 MG chewable tablet Chew 81 mg by mouth daily.   06/28/2022   Prenatal Vit-Fe Fumarate-FA (PRENATAL MULTIVITAMIN) TABS tablet Take 1 tablet by mouth daily at 12 noon.   06/28/2022   famotidine (PEPCID) 40 MG tablet Take 1 tablet (40 mg total) by mouth every evening. 30 tablet 0    fluticasone (FLONASE) 50 MCG/ACT nasal spray Place 1 spray into both nostrils 2 (two) times daily as needed (nasal congestion). 16 g 11 More than a month   fluticasone (FLOVENT HFA) 110 MCG/ACT inhaler Inhale 2 puffs into the lungs 2 (two) times daily. 1 each 11     levocetirizine (XYZAL) 5 MG tablet Take 1 tablet (5 mg total) by mouth every evening. 30 tablet 11    metoCLOPramide (REGLAN) 10 MG tablet Take 1 tablet (10 mg total) by mouth in the morning, at noon, in the evening, and at bedtime. 90 tablet 2    montelukast (SINGULAIR) 10 MG tablet Take 1 tablet (10 mg total) by mouth at bedtime. 30 tablet 11    ondansetron (ZOFRAN-ODT) 8 MG disintegrating tablet Take 1 tablet (8 mg total) by mouth every 8 (eight) hours as needed for nausea or vomiting. 30 tablet 2    promethazine (PHENERGAN) 25 MG suppository Place 25 mg rectally every 6 (six) hours as needed for nausea or vomiting.       Review of Systems  Constitutional:  Negative for chills and fever.  HENT:  Negative for congestion and sore throat.   Eyes:  Negative for pain and visual disturbance.  Respiratory:  Negative for cough, chest tightness and shortness of breath.   Cardiovascular:  Negative for chest pain.  Gastrointestinal:  Negative for abdominal pain, diarrhea, nausea and vomiting.  Endocrine: Negative for cold intolerance and heat intolerance.  Genitourinary:  Negative for dysuria and flank pain.  Musculoskeletal:  Negative for back pain.  Skin:  Negative for rash.  Allergic/Immunologic: Negative for food allergies.  Neurological:  Negative for dizziness and light-headedness.  Psychiatric/Behavioral:  Negative for agitation.    Physical Exam   Blood pressure 135/78, pulse 78, temperature 97.6 F (36.4 C), temperature source Oral, resp. rate 16, height 5' 2.5" (1.588 m), weight 114.3 kg, last menstrual period 09/26/2021, SpO2 100 %, unknown if currently breastfeeding.  Patient Vitals for the past 24 hrs:  BP Temp Temp src Pulse Resp SpO2 Height Weight  06/29/22 1500 135/78 -- -- 78 -- 100 % -- --  06/29/22 1445 (!) 139/91 -- -- 73 -- 100 % -- --  06/29/22 1430 (!) 144/85 -- -- 76 -- 100 % -- --  06/29/22 1415 (!) 141/87 -- -- 66 -- 100 % -- --  06/29/22 1401 (!) 130/91 -- -- 76 --  -- -- --  06/29/22 1346 135/80 -- -- 78 -- -- -- --  06/29/22 1331 132/85 -- -- 75 -- -- -- --  06/29/22 1322 136/78 -- -- 82 -- -- -- --  06/29/22 1318 -- 97.6 F (36.4 C) Oral -- 16 -- -- --  06/29/22 1308 129/77 -- -- 78 -- -- -- --  06/29/22 1244 129/76 98.7 F (37.1 C) Oral 80 16 98 % -- --  06/29/22 1239 -- -- -- -- -- -- 5' 2.5" (1.588 m) 114.3 kg    Physical Exam Vitals and nursing note reviewed.  Constitutional:      General: She is not in acute distress.    Appearance: She is well-developed.     Comments: Pregnant female  HENT:     Head: Normocephalic and atraumatic.  Eyes:     General: No scleral icterus.    Conjunctiva/sclera: Conjunctivae normal.  Cardiovascular:     Rate and Rhythm: Normal rate.  Pulmonary:     Effort: Pulmonary effort is normal.  Chest:     Chest wall: No tenderness.  Abdominal:     Palpations: Abdomen is soft.     Tenderness: There is no abdominal tenderness. There is no guarding or rebound.       Comments: Gravid 39 wks  Genitourinary:    Vagina: Normal.  Musculoskeletal:        General: Normal range of motion.     Cervical back: Normal range of motion and neck supple.     Right lower leg: Edema present.     Left lower leg: Edema present.  Skin:    General: Skin is warm and dry.     Findings: No rash.  Neurological:     Mental Status: She is alert and oriented to person, place, and time.     MAU Course  Procedures  MDM: moderate  This patient presents to the ED for concern of   Chief Complaint  Patient presents with   Headache   Hypertension     These complains involves an extensive number of treatment options, and is a complaint that carries with it a high risk of complications and morbidity.  The differential diagnosis for  1. Elevated blood pressure INCLUDES transient elevation, gestational hypertension, preeclampsia  Co morbidities that complicate the patient evaluation: previously had severe preeclampsia with G1,  Obesity (BMI 45)  Additional history obtained from mother  External records from outside source obtained and reviewed including Scanned media records  Lab Tests: CMP, CBC, and Urine Protein Creatinine Ratio Results for orders placed or performed during the hospital encounter of 06/29/22 (from the past 24 hour(s))  Protein / creatinine ratio, urine     Status: None   Collection Time: 06/29/22  1:08 PM  Result Value Ref Range   Creatinine, Urine 239 mg/dL   Total Protein, Urine 28 mg/dL   Protein Creatinine Ratio 0.12 0.00 - 0.15 mg/mg[Cre]  Urinalysis, Routine w reflex microscopic -Urine, Clean Catch     Status: Abnormal   Collection Time: 06/29/22  1:08 PM  Result Value Ref Range   Color, Urine AMBER (A) YELLOW   APPearance CLEAR CLEAR   Specific Gravity, Urine >1.030 (H) 1.005 - 1.030   pH 6.0 5.0 - 8.0   Glucose, UA NEGATIVE NEGATIVE mg/dL   Hgb urine dipstick SMALL (A) NEGATIVE   Bilirubin Urine SMALL (A) NEGATIVE   Ketones, ur NEGATIVE NEGATIVE mg/dL   Protein, ur NEGATIVE NEGATIVE mg/dL   Nitrite NEGATIVE NEGATIVE   Leukocytes,Ua TRACE (A) NEGATIVE  Urinalysis, Microscopic (reflex)     Status: Abnormal   Collection Time: 06/29/22  1:08 PM  Result Value Ref Range   RBC / HPF 0-5 0 - 5 RBC/hpf   WBC, UA 0-5 0 - 5 WBC/hpf   Bacteria, UA RARE (A) NONE SEEN   Squamous Epithelial / HPF  0-5 0 - 5 /HPF   Mucus PRESENT   Type and screen [place]     Status: None (Preliminary result)   Collection Time: 06/29/22  1:34 PM  Result Value Ref Range   ABO/RH(D) PENDING    Antibody Screen PENDING    Sample Expiration      07/02/2022,2359 Performed at [facility], [place] [address], [place], [place] [address]   Comprehensive metabolic panel     Status: Abnormal   Collection Time: 06/29/22  1:35 PM  Result Value Ref Range   Sodium 136 135 - 145 mmol/L   Potassium 3.8 3.5 - 5.1 mmol/L   Chloride 106 98 - 111 mmol/L   CO2 21 (L) 22 - 32 mmol/L   Glucose,  Bld 81 70 - 99 mg/dL   BUN 8 6 - 20 mg/dL   Creatinine, Ser 0.69 0.44 - 1.00 mg/dL   Calcium 8.4 (L) 8.9 - 10.3 mg/dL   Total Protein 5.7 (L) 6.5 - 8.1 g/dL   Albumin 2.5 (L) 3.5 - 5.0 g/dL   AST 27 15 - 41 U/L   ALT 25 0 - 44 U/L   Alkaline Phosphatase 139 (H) 38 - 126 U/L   Total Bilirubin 0.4 0.3 - 1.2 mg/dL   GFR, Estimated >60 >60 mL/min   Anion gap 9 5 - 15  CBC     Status: Abnormal   Collection Time: 06/29/22  1:35 PM  Result Value Ref Range   WBC 13.3 (H) 4.0 - 10.5 K/uL   RBC 3.87 3.87 - 5.11 MIL/uL   Hemoglobin 9.9 (L) 12.0 - 15.0 g/dL   HCT 30.6 (L) 36.0 - 46.0 %   MCV 79.1 (L) 80.0 - 100.0 fL   MCH 25.6 (L) 26.0 - 34.0 pg   MCHC 32.4 30.0 - 36.0 g/dL   RDW 14.1 11.5 - 15.5 %   Platelets 320 150 - 400 K/uL   nRBC 0.0 0.0 - 0.2 %  Rapid HIV screen (HIV 1/2 Ab+Ag)     Status: None   Collection Time: 06/29/22  1:35 PM  Result Value Ref Range   HIV-1 P24 Antigen - HIV24 NON REACTIVE NON REACTIVE   HIV 1/2 Antibodies NON REACTIVE NON REACTIVE   Interpretation (HIV Ag Ab)      A non reactive test result means that HIV 1 or HIV 2 antibodies and HIV 1 p24 antigen were not detected in the specimen.    I ordered, and personally interpreted labs.  The pertinent results include:  Mild anemia   Medicines ordered and prescription drug management:  Medications: Excedrin   Reevaluation of the patient after these medicines showed that the patient improved. Denies HA I have reviewed the patients home medicines and have made adjustments as needed  MAU Course:  3:23 PM Spoke with Dr Mancel Bale- she agrees with admission for IOL due to [place]. She will contact [facility] to come admit the patient.   Dispostion: admitted to the hospital  Assessment and Plan   1. Gestational hypertension, third trimester     Admit to LD for IOL Does not meet severe PEC criteria Checked in office and was 1.5cm Confirmed VTX here in [place] 06/29/2022, 3:23 PM

## 2022-06-29 NOTE — Progress Notes (Signed)
Tracing reviewed remotely by me cat 1 Ctxs q2-19mn IUPC in place with MVUs 135 Titrate pitocin to MVUs 180-220 Pt attempting unmedicated delivery Anticipate NSVD

## 2022-06-29 NOTE — MAU Note (Signed)
Tracy Crawford is a 22 y.o. at 64w3dhere in MAU reporting: was at the office this AM and had elevated BP, 138/91. Has hx of pre-e. Mild headache and states vision is foggy. No RUQ pain. Had membrane sweep in office today and saw some bleeding after. No LOF, irregular UC. +FM  Onset of complaint: today  Pain score: 2/10  Vitals:   06/29/22 1244  BP: 129/76  Pulse: 80  Resp: 16  Temp: 98.7 F (37.1 C)  SpO2: 98%     FHT:132  Lab orders placed from triage: UA

## 2022-06-29 NOTE — MAU Note (Signed)
Pt informed that the ultrasound is considered a limited OB ultrasound and is not intended to be a complete ultrasound exam.  Patient also informed that the ultrasound is not being completed with the intent of assessing for fetal or placental anomalies or any pelvic abnormalities.  Explained that the purpose of today's ultrasound is to assess for presentation.  Patient acknowledges the purpose of the exam and the limitations of the study.    Vertex presentation confirmed 

## 2022-06-29 NOTE — Progress Notes (Signed)
Labor Progress Note  Tracy Crawford is a 22 y.o. female, G2P1001, IUP at 39.3 weeks, presenting for IOL for GHTN, elevtaed BP in office today and here in MAU, PCR and labs unremarkable, at 0.12. Asymptomatic. Pt endorse + Fm. Denies vaginal leakage. Denies vaginal bleeding. Denies feeling cxt's.    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 no lesion or prodromal s/sx.  Vitamin D deficiency: Noted at NOB, rx'd supplement Bipolar disorder/OCD/MDD with SI Attempt:  sees [facility] at [facility] not currently on medication. Intentional drug overdose 05/2017. Asthma: Takes pro air Maternal obesity complicating pregnancy, childbirth and the puerperium, antepartum: BMI 40, antenatal testing from 36 weeks, follow growth. Asymptomatic bacteriuria in pregnancy:  25-50K enterococcus at 31 weeks, defer tx unless sx, TOC neg. Anemia of pregnancy: Hgb 10.2 at 28 weeks, rx'd Fe.  10.8 at 31 weeks, continue Fe. Heart murmur: As child, not heard on NOB exam History of attempted suicide    History of severe pre-eclampsia: 2019, induction.  PIH labs/PCR at NOB, recommend ASA. History of multiple allergies: Augmentin, Ceftin, shellfish, several fruits  Subjective: Pt stable in bed, pt has support at bedside, reviewed R/B/A of pitocin with IUPC, pt ok for IUPC placement. Pt feeeling cxt but tolerates well, planning for no epidural.  Patient Active Problem List   Diagnosis Date Noted   Gestational hypertension 06/29/2022   Severe persistent asthma with (acute) exacerbation 08/24/2021   Second hand tobacco smoke exposure 08/24/2021   Suicide attempt ([place]) 01/19/2018   Herpes genitalis in women 01/19/2018   MDD (major depressive disorder), severe ([place]) 05/11/2017   Anaphylactic reaction due to food, subsequent encounter 11/20/2015  .      Objective: BP (!) 152/99   Pulse 71   Temp 97.6 F (36.4 C) (Oral)   Resp 16   Ht  5' 2.5" (1.588 m)   Wt 114.3 kg   LMP 09/26/2021   SpO2 100%   BMI 45.36 kg/m  No intake/output data recorded. No intake/output data recorded. NST: FHR baseline 120  bpm, Variability: moderate, Accelerations:present, Decelerations:  Present  random spontaneously resolving varies noted at times.  = Cat 1-2/Reactive CTX:  irregular, every 2-4 minutes Uterus gravid, soft non tender, moderate to palpate with contractions.  SVE:  Dilation: 5 Effacement (%): 60 Station: -2 Exam by:: [facility], CNM Pitocin at 2 mUn/min IUPC place and tolerated well.   Assessment:  Tracy Crawford is a 22 y.o. female, G2P1001, IUP at 39.3 weeks, presenting for IOL for GHTN, elevtaed BP in office today and here in MAU, PCR and labs unremarkable, at 0.12. Asymptomatic. Pt progressing in latent labor s/p AROM and pitocin on .    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 no lesion or prodromal s/sx.  Vitamin D deficiency: Noted at NOB, rx'd supplement Bipolar disorder/OCD/MDD with SI Attempt:  sees Natalie at [facility] not currently on medication. Intentional drug overdose 05/2017. Asthma: Takes pro air Maternal obesity complicating pregnancy, childbirth and the puerperium, antepartum: BMI 40, antenatal testing from 36 weeks, follow growth. Asymptomatic bacteriuria in pregnancy:  25-50K enterococcus at 31 weeks, defer tx unless sx, TOC neg. Anemia of pregnancy: Hgb 10.2 at 28 weeks, rx'd Fe.  10.8 at 31 weeks, continue Fe. Heart murmur: As child, not heard on NOB exam History of attempted suicide    History of severe pre-eclampsia: 2019,  induction.  PIH labs/PCR at NOB, recommend ASA. History of multiple allergies: Augmentin, Ceftin, shellfish, several fruits Patient Active Problem List   Diagnosis Date Noted   Gestational hypertension 06/29/2022   Severe persistent asthma with (acute) exacerbation 08/24/2021   Second hand  tobacco smoke exposure 08/24/2021   Suicide attempt ([place]) 01/19/2018   Herpes genitalis in women 01/19/2018   MDD (major depressive disorder), severe ([place]) 05/11/2017   Anaphylactic reaction due to food, subsequent encounter 11/20/2015   NICHD: Category 1/2 with variables , spontaneously resolved.   Membranes:  AROM, clear @  1705 on 2/27, no s/s of infection  MVUs Pending until 10 mins strip  Induction:    Cytotec xN/A  Foley Bulb: N/A  Pitocin - 2  Pain management:               IV pain management: x PRN  Nitrous: PRN             Epidural placement:  PRN  GBS Negative  GHTN: asymptomatic, BP 152/99, PCR 0.12, other labs unremarkable.  Plan: Continue labor plan Asthma: albuterol inhaler PRN, pro air.  Continuous monitoring Rest Ambulate Frequent position changes to facilitate fetal rotation and descent. Will reassess with cervical exam at 4 hours or earlier if necessary GHTN: Monitor BP, hydralazine protocol if SR BP  Continue pitocin per protocol Anticipate labor progression and vaginal delivery.  Monitor Mood PP Estimated Birth Time: 0213 on 2/28  Md Mancel Bale aware of plan and verbalized agreement. Assumed care at 1900.   [place], FNP-C, PMHNP-BC  [address] [place], [place] [address]  Cell: [phone]  Office Phone: [phone] Fax: [phone] 06/29/2022  6:44 PM

## 2022-06-30 ENCOUNTER — Inpatient Hospital Stay (HOSPITAL_COMMUNITY): Payer: Medicaid Other | Admitting: Anesthesiology

## 2022-06-30 ENCOUNTER — Encounter (HOSPITAL_COMMUNITY): Payer: Self-pay | Admitting: Obstetrics and Gynecology

## 2022-06-30 LAB — CBC
HCT: 26.5 % — ABNORMAL LOW (ref 36.0–46.0)
HCT: 35.9 % — ABNORMAL LOW (ref 36.0–46.0)
Hemoglobin: 11.3 g/dL — ABNORMAL LOW (ref 12.0–15.0)
Hemoglobin: 8.6 g/dL — ABNORMAL LOW (ref 12.0–15.0)
MCH: 25.2 pg — ABNORMAL LOW (ref 26.0–34.0)
MCH: 25.5 pg — ABNORMAL LOW (ref 26.0–34.0)
MCHC: 31.5 g/dL (ref 30.0–36.0)
MCHC: 32.5 g/dL (ref 30.0–36.0)
MCV: 78.6 fL — ABNORMAL LOW (ref 80.0–100.0)
MCV: 80.1 fL (ref 80.0–100.0)
Platelets: 297 10*3/uL (ref 150–400)
Platelets: 348 10*3/uL (ref 150–400)
RBC: 3.37 MIL/uL — ABNORMAL LOW (ref 3.87–5.11)
RBC: 4.48 MIL/uL (ref 3.87–5.11)
RDW: 14 % (ref 11.5–15.5)
RDW: 14.1 % (ref 11.5–15.5)
WBC: 17.2 10*3/uL — ABNORMAL HIGH (ref 4.0–10.5)
WBC: 21.2 10*3/uL — ABNORMAL HIGH (ref 4.0–10.5)
nRBC: 0 % (ref 0.0–0.2)
nRBC: 0 % (ref 0.0–0.2)

## 2022-06-30 LAB — RPR: RPR Ser Ql: NONREACTIVE

## 2022-06-30 MED ORDER — NIFEDIPINE ER OSMOTIC RELEASE 30 MG PO TB24
30.0000 mg | ORAL_TABLET | Freq: Every day | ORAL | Status: DC
  Administered 2022-06-30 – 2022-07-01 (×2): 30 mg via ORAL
  Filled 2022-06-30 (×2): qty 1

## 2022-06-30 MED ORDER — WITCH HAZEL-GLYCERIN EX PADS: 1.0000 | MEDICATED_PAD | CUTANEOUS | Status: DC | PRN

## 2022-06-30 MED ORDER — TETANUS-DIPHTH-ACELL PERTUSSIS 5-2.5-18.5 LF-MCG/0.5 IM SUSY: 0.5000 mL | PREFILLED_SYRINGE | Freq: Once | INTRAMUSCULAR | Status: DC

## 2022-06-30 MED ORDER — TRANEXAMIC ACID-NACL 1000-0.7 MG/100ML-% IV SOLN
INTRAVENOUS | Status: AC
  Administered 2022-06-30: 1000 mg
  Filled 2022-06-30: qty 100

## 2022-06-30 MED ORDER — TRANEXAMIC ACID-NACL 1000-0.7 MG/100ML-% IV SOLN: 1000.0000 mg | INTRAVENOUS | Status: DC

## 2022-06-30 MED ORDER — SENNOSIDES-DOCUSATE SODIUM 8.6-50 MG PO TABS
2.0000 | ORAL_TABLET | Freq: Every day | ORAL | Status: DC
  Administered 2022-07-01: 2 via ORAL
  Filled 2022-06-30: qty 2

## 2022-06-30 MED ORDER — DIPHENHYDRAMINE HCL 25 MG PO CAPS: 25.0000 mg | ORAL_CAPSULE | Freq: Four times a day (QID) | ORAL | Status: DC | PRN

## 2022-06-30 MED ORDER — SIMETHICONE 80 MG PO CHEW: 80.0000 mg | CHEWABLE_TABLET | ORAL | Status: DC | PRN

## 2022-06-30 MED ORDER — ZOLPIDEM TARTRATE 5 MG PO TABS: 5.0000 mg | ORAL_TABLET | Freq: Every evening | ORAL | Status: DC | PRN

## 2022-06-30 MED ORDER — ONDANSETRON HCL 4 MG PO TABS: 4.0000 mg | ORAL_TABLET | ORAL | Status: DC | PRN

## 2022-06-30 MED ORDER — IBUPROFEN 600 MG PO TABS
600.0000 mg | ORAL_TABLET | Freq: Four times a day (QID) | ORAL | Status: DC
  Administered 2022-06-30 – 2022-07-01 (×5): 600 mg via ORAL
  Filled 2022-06-30 (×6): qty 1

## 2022-06-30 MED ORDER — MISOPROSTOL 200 MCG PO TABS
ORAL_TABLET | ORAL | Status: AC
  Filled 2022-06-30: qty 4

## 2022-06-30 MED ORDER — COCONUT OIL OIL: 1.0000 | TOPICAL_OIL | Status: DC | PRN

## 2022-06-30 MED ORDER — LIDOCAINE HCL (PF) 1 % IJ SOLN
INTRAMUSCULAR | Status: DC | PRN
  Administered 2022-06-30: 11 mL via EPIDURAL

## 2022-06-30 MED ORDER — OXYCODONE HCL 5 MG PO TABS: 5.0000 mg | ORAL_TABLET | ORAL | Status: DC | PRN

## 2022-06-30 MED ORDER — LACTATED RINGERS AMNIOINFUSION: INTRAVENOUS | Status: DC

## 2022-06-30 MED ORDER — TRANEXAMIC ACID-NACL 1000-0.7 MG/100ML-% IV SOLN
1000.0000 mg | INTRAVENOUS | Status: AC
  Administered 2022-06-30: 1000 mg via INTRAVENOUS

## 2022-06-30 MED ORDER — PRENATAL MULTIVITAMIN CH
1.0000 | ORAL_TABLET | Freq: Every day | ORAL | Status: DC
  Administered 2022-06-30 – 2022-07-01 (×2): 1 via ORAL
  Filled 2022-06-30 (×2): qty 1

## 2022-06-30 MED ORDER — OXYCODONE HCL 5 MG PO TABS: 10.0000 mg | ORAL_TABLET | ORAL | Status: DC | PRN

## 2022-06-30 MED ORDER — ONDANSETRON HCL 4 MG/2ML IJ SOLN: 4.0000 mg | INTRAMUSCULAR | Status: DC | PRN

## 2022-06-30 MED ORDER — MISOPROSTOL 200 MCG PO TABS
800.0000 ug | ORAL_TABLET | Freq: Once | ORAL | Status: AC
  Administered 2022-06-30: 800 ug via RECTAL

## 2022-06-30 MED ORDER — ACETAMINOPHEN 325 MG PO TABS: 650.0000 mg | ORAL_TABLET | ORAL | Status: DC | PRN

## 2022-06-30 MED ORDER — BENZOCAINE-MENTHOL 20-0.5 % EX AERO
1.0000 | INHALATION_SPRAY | CUTANEOUS | Status: DC | PRN
  Filled 2022-06-30: qty 56

## 2022-06-30 MED ORDER — OXYTOCIN-SODIUM CHLORIDE 30-0.9 UT/500ML-% IV SOLN: 2.5000 [IU]/h | INTRAVENOUS | Status: DC | PRN

## 2022-06-30 MED ORDER — DIBUCAINE (PERIANAL) 1 % EX OINT: 1.0000 | TOPICAL_OINTMENT | CUTANEOUS | Status: DC | PRN

## 2022-06-30 MED ORDER — POLYSACCHARIDE IRON COMPLEX 150 MG PO CAPS
150.0000 mg | ORAL_CAPSULE | Freq: Every day | ORAL | Status: DC
  Administered 2022-07-01: 150 mg via ORAL
  Filled 2022-06-30: qty 1

## 2022-06-30 NOTE — Progress Notes (Addendum)
Lisaanne Solis is a 22 y.o. G2P1001 at 13w4dadmitted for IOL d/t GHTN  Subjective: Desires epidural  Objective: BP (!) 142/96   Pulse 75   Temp (!) 97 F (36.1 C) (Axillary)   Resp 16   Ht 5' 2.5" (1.588 m)   Wt 114.3 kg   LMP 09/26/2021   SpO2 100%   BMI 45.36 kg/m  No intake/output data recorded. No intake/output data recorded.  FHT:  FHR: 110s bpm, variability: moderate,  accelerations:  Present,  decelerations:  Present   UC:   q1-278m SVE:   Dilation: 7.5 Effacement (%): 80 Station: -2 Exam by:: Dr. RoMancel BaleSE placed  Labs: Lab Results  Component Value Date   WBC 17.2 (H) 06/30/2022   HGB 11.3 (L) 06/30/2022   HCT 35.9 (L) 06/30/2022   MCV 80.1 06/30/2022   PLT 348 06/30/2022    Assessment / Plan: Induction of labor due to gestational hypertension,  progressing well on pitocin  Labor:  Pitocin decreased d/t tachysystole.  Will cont to observe.   Will hold now d/t lack of rest between contractions. Preeclampsia:  no signs or symptoms of toxicity Fetal Wellbeing:   Overasll reassuring with reactive scalp stim.  Fluctuates between cat 1 and 2. Pain Control:   requesting epidural now and awaiting anesthesiologist I/D:   GBS neg Anticipated MOD:  NSVD  AnDelice LeschMD 06/30/2022, 12:25 AM

## 2022-06-30 NOTE — Anesthesia Preprocedure Evaluation (Signed)
Anesthesia Evaluation  Patient identified by MRN, date of birth, ID band Patient awake    Reviewed: Allergy & Precautions, Patient's Chart, lab work & pertinent test results  History of Anesthesia Complications (+) PONV and history of anesthetic complications  Airway Mallampati: II  TM Distance: >3 FB Neck ROM: Full    Dental no notable dental hx. (+) Teeth Intact   Pulmonary asthma    Pulmonary exam normal breath sounds clear to auscultation       Cardiovascular hypertension, Normal cardiovascular exam Rhythm:Regular Rate:Normal     Neuro/Psych negative neurological ROS  negative psych ROS   GI/Hepatic negative GI ROS, Neg liver ROS,,,  Endo/Other  negative endocrine ROS    Renal/GU negative Renal ROS  negative genitourinary   Musculoskeletal negative musculoskeletal ROS (+)    Abdominal  (+) + obese  Peds negative pediatric ROS (+)  Hematology negative hematology ROS (+)   Anesthesia Other Findings   Reproductive/Obstetrics (+) Pregnancy                             Anesthesia Physical Anesthesia Plan  ASA: 3  Anesthesia Plan: Epidural   Post-op Pain Management:    Induction:   PONV Risk Score and Plan: 2 and Treatment may vary due to age or medical condition  Airway Management Planned: Natural Airway  Additional Equipment:   Intra-op Plan:   Post-operative Plan:   Informed Consent: I have reviewed the patients History and Physical, chart, labs and discussed the procedure including the risks, benefits and alternatives for the proposed anesthesia with the patient or authorized representative who has indicated his/her understanding and acceptance.       Plan Discussed with: CRNA  Anesthesia Plan Comments:         Anesthesia Quick Evaluation

## 2022-06-30 NOTE — Anesthesia Postprocedure Evaluation (Signed)
Anesthesia Post Note  Patient: Tracy Crawford  Procedure(s) Performed: AN AD HOC LABOR EPIDURAL     Patient location during evaluation: Mother Baby Anesthesia Type: Epidural Level of consciousness: awake and alert Pain management: pain level controlled Vital Signs Assessment: post-procedure vital signs reviewed and stable Respiratory status: spontaneous breathing, nonlabored ventilation and respiratory function stable Cardiovascular status: stable Postop Assessment: no headache, no backache and epidural receding Anesthetic complications: no  No notable events documented.  Last Vitals:  Vitals:   06/30/22 0601 06/30/22 1000  BP: (!) 155/93 139/80  Pulse: 84 88  Resp: 16 18  Temp: (!) 38.3 C 36.7 C  SpO2:  100%    Last Pain:  Vitals:   06/30/22 1000  TempSrc: Oral  PainSc: 2    Pain Goal:                   Charlen Bakula N

## 2022-06-30 NOTE — Lactation Note (Addendum)
This note was copied from a baby's chart. Lactation Consultation Note  Patient Name: Tracy Crawford [id number] Date: 06/30/2022 Age:23 hours  Reason for consult: Initial assessment;Exclusive pumping and bottle feeding;Term  Mother had family in room. She has requested that medical information not be discussed in front of family. Mother states she isn't going to latch baby to breast and she will pump and give expressed milk by bottle. Infant has been feeding 10-20 ml of expressed milk by bottle since birth.  RN reports that there are several bags of "breast milk" in mother's refrigerator that contains several ounces. Family has assisted mother with making bottles for baby from the milk in the refrigerator.  There is not a pump set up in the room nor has RN observed mother pumping or hand expressing. Mother reports she has an electric pump at home.   Instructed mother to call if she assistance with pumping or has any questions/needs.  Maternal Data Does the patient have breastfeeding experience prior to this delivery?: Yes How long did the patient breastfeed?: pumped  Feeding Mother's Current Feeding Choice: Breast Milk Nipple Type: Slow - flow  Interventions Interventions: [facility] brochure  Discharge Pump: DEBP;Personal  Consult Status Consult Status: Follow-up Date: 07/01/22 Follow-up type: In-patient   Stana Bunting M 06/30/2022, 5:25 PM

## 2022-06-30 NOTE — Plan of Care (Signed)

## 2022-06-30 NOTE — Anesthesia Procedure Notes (Signed)
Epidural Patient location during procedure: OB Start time: 06/30/2022 12:51 AM End time: 06/30/2022 1:14 AM  Staffing Anesthesiologist: Lynda Rainwater, MD Performed: anesthesiologist   Preanesthetic Checklist Completed: patient identified, IV checked, site marked, risks and benefits discussed, surgical consent, monitors and equipment checked, pre-op evaluation and timeout performed  Epidural Patient position: sitting Prep: ChloraPrep Patient monitoring: heart rate, cardiac monitor, continuous pulse ox and blood pressure Approach: midline Location: L2-L3 Injection technique: LOR saline  Needle:  Needle type: Tuohy  Needle gauge: 17 G Needle length: 9 cm Needle insertion depth: 13 cm Catheter type: closed end flexible Catheter size: 20 Guage Catheter at skin depth: 20 cm Test dose: negative  Assessment Events: blood not aspirated, injection not painful, no injection resistance, no paresthesia and negative IV test  Additional Notes Reason for block:procedure for pain

## 2022-07-01 MED ORDER — POLYSACCHARIDE IRON COMPLEX 150 MG PO CAPS: 150.0000 mg | ORAL_CAPSULE | Freq: Every day | ORAL | 0 refills | Status: DC

## 2022-07-01 MED ORDER — BENZOCAINE-MENTHOL 20-0.5 % EX AERO: 1.0000 | INHALATION_SPRAY | CUTANEOUS | 1 refills | Status: DC | PRN

## 2022-07-01 MED ORDER — IBUPROFEN 600 MG PO TABS: 600.0000 mg | ORAL_TABLET | Freq: Four times a day (QID) | ORAL | 0 refills | Status: DC

## 2022-07-01 MED ORDER — OXYCODONE HCL 5 MG PO TABS: 5.0000 mg | ORAL_TABLET | ORAL | 0 refills | Status: DC | PRN

## 2022-07-01 MED ORDER — NIFEDIPINE ER 30 MG PO TB24: 30.0000 mg | ORAL_TABLET | Freq: Every day | ORAL | 1 refills | Status: DC

## 2022-07-01 MED ORDER — ACETAMINOPHEN 325 MG PO TABS: 650.0000 mg | ORAL_TABLET | ORAL | 0 refills | Status: DC | PRN

## 2022-07-01 NOTE — Clinical Social Work Maternal (Signed)
CLINICAL SOCIAL WORK MATERNAL/CHILD NOTE  Patient Details  Name: Tracy Crawford MRN: [id number] Date of Birth: 10/18/2000  Date:  07/01/2022  Clinical Social Worker Initiating Note:  Bernadene Bell Kirbie Stodghill Date/Time: Initiated:  07/01/22/1148     Child's Name:  Tracy Crawford   Biological Parents:  Mother, Father [name] 12-08-2001[name] 11/16/1997)   Need for Interpreter:  None   Reason for Referral:  Behavioral Health Concerns   Address:  [address] [place] [address]    Phone number:  [phone] (home)     Additional phone number:   Household Members/Support Persons (HM/SP):   Household Member/Support Person 1, Household Member/Support Person 2   HM/SP Name Relationship DOB or Age  HM/SP -1 [name] FOB 11/16/1997  HM/SP -2 [name] daughter 02/26/2018  HM/SP -3        HM/SP -4        HM/SP -5        HM/SP -6        HM/SP -7        HM/SP -8          Natural Supports (not living in the home):  Parent   Professional Supports: None   Employment: Full-time   Type of Work: Teaching laboratory technician   Education:  [place] arranged:    Museum/gallery curator Resources:  [employer]   Other Resources:  Physicist, medical     Cultural/Religious Considerations Which May Impact Care:    Strengths:  Ability to meet basic needs  , Home prepared for child  , Pediatrician chosen   Psychotropic Medications:         Pediatrician:    [place]  Pediatrician List:   [facility] for [place]      Pediatrician Fax Number:    Risk Factors/Current Problems:  Mental Health Concerns     Cognitive State:  Able to Concentrate  , Alert     Mood/Affect:  Calm  , Comfortable     CSW Assessment: CSW received consult for hx of Anxiety, Depression and Bipolar. CSW met with MOB to offer support and complete assessment. CSW entered the room  observed MOB walking around the room and infant lying safely on her back on the bed supervised by MOB. CSW introduced self, CSW role and reason for visit, MOB was agreeable to visit. CSW inquired about how MOB was feeling, MOB reported fine. CSW confirmed MOB address and phone number, MOB confirmed the number and address on file were accurate. CSW inquired about MOB MH hx, MOB reported she was diagnosed with Anxiety, Depression, Bipolar, Schizoaffective Disorder and OCD in 2019. CSW inquired about current treatment MOB reported she does not take any medication. MOB reported she does not feel that she needs medication at this time. MOB reported she was in therapy at [facility] but not currently. Mob reported she would like to go back to therapy, CSW encouraged MOB to follow up on referral to [place] the her OB sent. MOB reported she has competed the initial assessment but has not heard back. MOB was agreeable to following up upon discharge. CSW also provided MOB additional Mental Health resources. CSW Inquired about how MOB copes with her Mental Health symptoms, MOB reported she has learned helpful skills like knowing when to step away, take deep  breaths and think before she reacts to situations. CSW inquired about MOB Bipolar, MOB reported her Bipolar symptoms are more manic and she reported her last episode was during pregnancy but less severe then she she was not pregnant. CSW inquired about treatment to manage symptoms MOB reported she is no interested in medication at this time but feels comfortable reaching out to her OB if the needs may arise. CSW assessed for current safety, MOB denied any SI, HI or DV. CSW provided education regarding the baby blues period vs. perinatal mood disorders, discussed treatment and gave resources for mental health follow up if concerns arise.  CSW recommends self-evaluation during the postpartum time period using the New Mom Checklist from Postpartum  Progress and encouraged MOB to contact a medical professional if symptoms are noted at any time.    CSW provided review of Sudden Infant Death Syndrome (SIDS) precautions.  MOB identified [facility] for children for infants follow up care. MOB reported she has all necessary items for the infant including a pack n play, bassinet and car seat.CSW identifies no further need for intervention and no barriers to discharge at this time.  CSW Plan/Description:  No Further Intervention Required/No Barriers to Discharge, Psychosocial Support and Ongoing Assessment of Needs, Sudden Infant Death Syndrome (SIDS) Education, Perinatal Mood and Anxiety Disorder (PMADs) Education    [name], LCSW 07/01/2022, 12:04 PM

## 2022-07-01 NOTE — Discharge Summary (Signed)
Postpartum Discharge Summary  Date of Service updated2/29/24     Patient Name: Tracy Crawford DOB: 06-30-2000 MRN: [id number]  Date of admission: 06/29/2022 Delivery date:06/30/2022  Delivering provider: Everett Graff  Date of discharge: 07/01/2022  Admitting diagnosis: Gestational hypertension [O13.9] Intrauterine pregnancy: [redacted]w[redacted]d    Secondary diagnosis:  Principal Problem:   Gestational hypertension  Additional problems:     Discharge diagnosis: Term Pregnancy Delivered, Gestational Hypertension, and Anemia                                              Post partum procedures:   Augmentation: AROM and Pitocin Complications: None  Hospital course: Onset of Labor With Vaginal Delivery      22y.o. yo [id number] admitted for induction  on 06/29/2022. Labor course was complicated by see above  Membrane Rupture Time/Date: 5:05 PM ,06/29/2022   Delivery Method:Vaginal, Spontaneous  Episiotomy: None  Lacerations:  Labial  Patient had a postpartum course complicated by tension headache.  She is ambulating, tolerating a regular diet, passing flatus, and urinating well. Patient is discharged home in stable condition on 07/01/22.  Newborn Data: Birth date:06/30/2022  Birth time:2:25 AM  Gender:Female  Living status:Living  Apgars:8 ,9  Weight:3510 g   Magnesium Sulfate received: No BMZ received: No Rhophylac:No MMR:No T-DaP:Given postpartum Flu: No Transfusion:No  Physical exam  Vitals:   06/30/22 1400 06/30/22 1800 06/30/22 1957 07/01/22 0341  BP: 130/73 121/79 123/88 114/60  Pulse: 78 84 78 80  Resp: '16 18 16 17  '$ Temp: 98.1 F (36.7 C) 98.4 F (36.9 C) 97.9 F (36.6 C) 98.2 F (36.8 C)  TempSrc: Oral Oral Oral   SpO2: 98% 100%    Weight:      Height:       General: alert and cooperative Lochia: appropriate Uterine Fundus: firm Incision: N/A DVT Evaluation: Negative Homan's sign. Labs: Lab Results  Component Value Date   WBC 21.2 (H) 06/30/2022    HGB 8.6 (L) 06/30/2022   HCT 26.5 (L) 06/30/2022   MCV 78.6 (L) 06/30/2022   PLT 297 06/30/2022      Latest Ref Rng & Units 06/29/2022    1:35 PM  CMP  Glucose 70 - 99 mg/dL 81   BUN 6 - 20 mg/dL 8   Creatinine 0.44 - 1.00 mg/dL 0.69   Sodium 135 - 145 mmol/L 136   Potassium 3.5 - 5.1 mmol/L 3.8   Chloride 98 - 111 mmol/L 106   CO2 22 - 32 mmol/L 21   Calcium 8.9 - 10.3 mg/dL 8.4   Total Protein 6.5 - 8.1 g/dL 5.7   Total Bilirubin 0.3 - 1.2 mg/dL 0.4   Alkaline Phos 38 - 126 U/L 139   AST 15 - 41 U/L 27   ALT 0 - 44 U/L 25    Edinburgh Score:    06/30/2022   10:17 AM  Edinburgh Postnatal Depression Scale Screening Tool  I have been able to laugh and see the funny side of things. 0  I have looked forward with enjoyment to things. 0  I have blamed myself unnecessarily when things went wrong. 0  I have been anxious or worried for no good reason. 0  I have felt scared or panicky for no good reason. 0  Things have been getting on top of me. 0  I have been so unhappy that I have had difficulty sleeping. 0  I have felt sad or miserable. 0  I have been so unhappy that I have been crying. 0  The thought of harming myself has occurred to me. 0  Edinburgh Postnatal Depression Scale Total 0      After visit meds:  Allergies as of 07/01/2022       Reactions   Other Anaphylaxis   Shellfish Allergy Anaphylaxis   Apple Itching   Augmentin [amoxicillin-pot Clavulanate] Hives   Has patient had a PCN reaction causing immediate rash, facial/tongue/throat swelling, SOB or lightheadedness with hypotension: Unknown Has patient had a PCN reaction causing severe rash involving mucus membranes or skin necrosis: Unknown Has patient had a PCN reaction that required hospitalization: Unknown Has patient had a PCN reaction occurring within the last 10 years: Yes If all of the above answers are "NO", then may proceed with Cephalosporin use.   Banana Itching   Ceftin [cefuroxime Axetil]  Hives   Unknown reaction   Cherry    Justicia Adhatoda (malabar Nut Tree) [justicia Adhatoda]    All nuts grown on tree   Peach [prunus Persica] Itching   Plum Pulp Itching   Watermelon [citrullus Vulgaris] Itching        Medication List     STOP taking these medications    aspirin 81 MG chewable tablet   metoCLOPramide 10 MG tablet Commonly known as: REGLAN       TAKE these medications    acetaminophen 325 MG tablet Commonly known as: Tylenol Take 2 tablets (650 mg total) by mouth every 4 (four) hours as needed (for pain scale < 4).   albuterol 108 (90 Base) MCG/ACT inhaler Commonly known as: Ventolin HFA Inhale 2 puffs into the lungs every 4 (four) hours as needed for wheezing or shortness of breath (coughing fits).   albuterol (2.5 MG/3ML) 0.083% nebulizer solution Commonly known as: PROVENTIL Take 3 mLs (2.5 mg total) by nebulization every 4 (four) hours as needed for wheezing or shortness of breath (coughing fits).   benzocaine-Menthol 20-0.5 % Aero Commonly known as: DERMOPLAST Apply 1 Application topically as needed for irritation (perineal discomfort).   famotidine 40 MG tablet Commonly known as: PEPCID Take 1 tablet (40 mg total) by mouth every evening.   fluticasone 110 MCG/ACT inhaler Commonly known as: FLOVENT HFA Inhale 2 puffs into the lungs 2 (two) times daily.   fluticasone 50 MCG/ACT nasal spray Commonly known as: FLONASE Place 1 spray into both nostrils 2 (two) times daily as needed (nasal congestion).   ibuprofen 600 MG tablet Commonly known as: ADVIL Take 1 tablet (600 mg total) by mouth every 6 (six) hours.   iron polysaccharides 150 MG capsule Commonly known as: NIFEREX Take 1 capsule (150 mg total) by mouth daily. Start taking on: July 02, 2022   levocetirizine 5 MG tablet Commonly known as: XYZAL Take 1 tablet (5 mg total) by mouth every evening.   montelukast 10 MG tablet Commonly known as: Singulair Take 1 tablet (10 mg  total) by mouth at bedtime.   NIFEdipine 30 MG 24 hr tablet Commonly known as: ADALAT CC Take 1 tablet (30 mg total) by mouth daily. Start taking on: July 02, 2022   ondansetron 8 MG disintegrating tablet Commonly known as: ZOFRAN-ODT Take 1 tablet (8 mg total) by mouth every 8 (eight) hours as needed for nausea or vomiting.   oxyCODONE 5 MG immediate release tablet Commonly known as: Oxy IR/ROXICODONE  Take 1 tablet (5 mg total) by mouth every 4 (four) hours as needed (pain scale 4-7).   prenatal multivitamin Tabs tablet Take 1 tablet by mouth daily at 12 noon.   promethazine 25 MG suppository Commonly known as: PHENERGAN Place 25 mg rectally every 6 (six) hours as needed for nausea or vomiting.         Discharge home in stable condition Infant Feeding: Breast Infant Disposition:home with mother Discharge instruction: per After Visit Summary and Postpartum booklet. Activity: Advance as tolerated. Pelvic rest for 6 weeks.  Diet: routine diet Anticipated Birth Control: Unsure Postpartum Appointment:1 week Additional Postpartum F/U: BP check 1 week Future Appointments:No future appointments. Follow up Visit:  [facility]. Schedule an appointment as soon as possible for a visit in 1 week(s).   Specialty: Obstetrics and Gynecology Contact information: [address] [place] [phone]                    07/01/2022 Betsy Coder, MD

## 2022-07-01 NOTE — Clinical Social Work Maternal (Incomplete)
  CLINICAL SOCIAL WORK MATERNAL/CHILD NOTE  Patient Details  Name: Tracy Crawford MRN: [id number] Date of Birth: 07/12/2000  Date:  07/01/2022  Clinical Social Worker Initiating Note:  Bernadene Bell Bowser Date/Time: Initiated:  07/01/22/1148     Child's Name:  Tracy Crawford   Biological Parents:  Mother, Father Tracy Crawford 05-06-2001Jeronimo Crawford 11/16/1997)   Need for Interpreter:  None   Reason for Referral:  Behavioral Health Concerns   Address:  [address] [place] [address]    Phone number:  [phone] (home)     Additional phone number: ***  Household Members/Support Persons (HM/SP):   Household Member/Support Person 1, Household Member/Support Person 2   HM/SP Name Relationship DOB or Age  HM/SP -1 Tracy Crawford FOB 11/16/1997  HM/SP -2 Tracy Crawford daughter 02/26/2018  HM/SP -3        HM/SP -4        HM/SP -5        HM/SP -6        HM/SP -7        HM/SP -8          Natural Supports (not living in the home):  Parent   Professional Supports: None   Employment: Full-time   Type of Work: Teaching laboratory technician   Education:  [place] arranged:    Museum/gallery curator Resources:  [employer]   Other Resources:  Physicist, medical     Cultural/Religious Considerations Which May Impact Care:  ***  Strengths:  Ability to meet basic needs  , Home prepared for child  , Pediatrician chosen   Psychotropic Medications:         Pediatrician:    [place]  Pediatrician List:   [facility] for [employer]      Pediatrician Fax Number:    Risk Factors/Current Problems:  Mental Health Concerns     Cognitive State:  Able to Concentrate  , Alert     Mood/Affect:  Calm  , Comfortable     CSW Assessment: ***  CSW Plan/Description:  No Further Intervention Required/No Barriers to Discharge, Psychosocial Support and Ongoing Assessment of  Needs, Sudden Infant Death Syndrome (SIDS) Education, Perinatal Mood and Anxiety Disorder (PMADs) Education    Roni Bread, LCSW 07/01/2022, 12:44 PM

## 2022-07-01 NOTE — Lactation Note (Signed)
This note was copied from a baby's chart. Lactation Consultation Note  Patient Name: Tracy Crawford [id number] Date: 07/01/2022 Age:22 years Reason for consult: Follow-up assessment;Term Mom has been giving the baby her BM that she previously pumped. Mom has her own DEBP but hasn't had to use it yet because she has BM all ready pumped. Suggested mom continue to pump so she doesn't loose her milk supply. Mom needs to be pumping every 3 hrs. Reviewed milk storage. Mom needed more bottle nipples. LC supplied. Mom doesn't feel that she needs Lactation services any more and plans on going home hopefully today. Reminded to call if needs assistance and of OP [place] services.  Maternal Data    Feeding Mother's Current Feeding Choice: Breast Milk  LATCH Score                    Lactation Tools Discussed/Used    Interventions    Discharge    Consult Status Consult Status: Complete Date: 07/01/22    Theodoro Kalata 07/01/2022, 3:33 AM

## 2022-07-03 ENCOUNTER — Inpatient Hospital Stay (HOSPITAL_COMMUNITY): Payer: Medicaid Other

## 2022-07-03 ENCOUNTER — Inpatient Hospital Stay (HOSPITAL_COMMUNITY)
Admission: RE | Admit: 2022-07-03 | Payer: Medicaid Other | Source: Home / Self Care | Admitting: Obstetrics and Gynecology

## 2022-07-12 ENCOUNTER — Telehealth (HOSPITAL_COMMUNITY): Payer: Self-pay | Admitting: *Deleted

## 2022-07-12 NOTE — Telephone Encounter (Signed)
Left phone voicemail message.  Odis Hollingshead, RN 07-12-2022 at 11:32am

## 2022-07-31 ENCOUNTER — Telehealth (HOSPITAL_COMMUNITY): Payer: Self-pay

## 2022-07-31 NOTE — Telephone Encounter (Signed)
Chart review.

## 2023-03-15 ENCOUNTER — Other Ambulatory Visit: Payer: Self-pay | Admitting: Family Medicine

## 2023-06-15 ENCOUNTER — Inpatient Hospital Stay (HOSPITAL_COMMUNITY)
Admission: AD | Admit: 2023-06-15 | Discharge: 2023-06-15 | Disposition: A | Discharge status: Home / Self Care | Payer: MEDICAID | Attending: Obstetrics and Gynecology | Admitting: Obstetrics and Gynecology

## 2023-06-15 ENCOUNTER — Encounter (HOSPITAL_COMMUNITY): Payer: Self-pay | Admitting: Obstetrics and Gynecology

## 2023-06-15 DIAGNOSIS — Z711 Person with feared health complaint in whom no diagnosis is made: Secondary | ICD-10-CM | POA: Insufficient documentation

## 2023-06-15 DIAGNOSIS — Z3202 Encounter for pregnancy test, result negative: Secondary | ICD-10-CM | POA: Diagnosis present

## 2023-06-15 DIAGNOSIS — Z789 Other specified health status: Secondary | ICD-10-CM

## 2023-06-15 LAB — POCT PREGNANCY, URINE: Preg Test, Ur: NEGATIVE

## 2023-06-15 NOTE — MAU Provider Note (Signed)
Event Date/Time   First Provider Initiated Contact with Patient 06/15/23 1837      S [name] is a 23 y.o. [id number] patient who presents to MAU today without complaints. Patient states that she was seen at [facility] for a STD exposure. She states that she was told that her pregnancy test was negative. She states that she left but later received a call stating that she had 3 positive test at [facility] and needed to be re-confirmed at another facility. LMP was 06/06/23. Patient last had sex on 06/11/23.    O BP 124/84   Pulse 72   Temp 97.7 F (36.5 C)   Resp 18   Ht 5' 2.5" (1.588 m)   Wt 102.5 kg   LMP 06/06/2023   BMI 40.68 kg/m  Physical Exam Vitals and nursing note reviewed.  Constitutional:      General: She is not in acute distress.    Appearance: Normal appearance.  HENT:     Head: Normocephalic.  Pulmonary:     Effort: Pulmonary effort is normal.  Musculoskeletal:     Cervical back: Normal range of motion.  Skin:    General: Skin is warm and dry.  Neurological:     Mental Status: She is alert and oriented to person, place, and time.  Psychiatric:        Mood and Affect: Mood normal.    Results for orders placed or performed during the hospital encounter of 06/15/23 (from the past 24 hours)  Pregnancy, urine POC     Status: None   Collection Time: 06/15/23  5:47 PM  Result Value Ref Range   Preg Test, Ur NEGATIVE NEGATIVE     A - CNM reviewed [facility] note from this afternoon. [facility] noted 3 positive pregnancy tests with "faint lines" that were read at and after 5 mins of testing.  - UPT in MAU negative  Medical screening exam complete - Reviewed negative results with patient and discussed that she will likely not have a positive result within 1 week of her LMP.  - Plan to discharge home.   P 1. Physically well but worried   2. Not currently pregnant    - Recommended that patient wait until the missing of her next menstrual period to re-test.  - Also  recommended to follow the strict testing times written on the box of Urine pregnancy tests.  - Discharge from MAU in stable condition - Patient given the option of transfer to [facility] for further evaluation or seek care in outpatient facility of choice  - Warning signs for worsening condition that would warrant emergency follow-up discussed - Patient may return to MAU as needed   Carlynn Herald, [place] 06/15/2023 6:44 PM

## 2023-06-15 NOTE — MAU Note (Signed)
.  Tracy Crawford is a 23 y.o. at [redacted]w[redacted]d here in MAU reporting: had positive pregnancy test at [facility]. Stated her LMP2/3/25. [facility] told her to get it reconfirmed somewhere else. C/o  headache that started earlier today.   LMP: 06/06/23 Onset of complaint: today Pain score: 7 There were no vitals filed for this visit.   FHT: n/a   Lab orders placed from triage: UPT

## 2023-08-27 ENCOUNTER — Inpatient Hospital Stay (HOSPITAL_COMMUNITY)
Admission: AD | Admit: 2023-08-27 | Discharge: 2023-08-27 | Disposition: A | Discharge status: Home / Self Care | Payer: MEDICAID | Attending: Obstetrics and Gynecology | Admitting: Obstetrics and Gynecology

## 2023-08-27 ENCOUNTER — Encounter (HOSPITAL_COMMUNITY): Payer: Self-pay

## 2023-08-27 DIAGNOSIS — N898 Other specified noninflammatory disorders of vagina: Secondary | ICD-10-CM | POA: Diagnosis not present

## 2023-08-27 DIAGNOSIS — B9689 Other specified bacterial agents as the cause of diseases classified elsewhere: Secondary | ICD-10-CM | POA: Diagnosis not present

## 2023-08-27 DIAGNOSIS — B3731 Acute candidiasis of vulva and vagina: Secondary | ICD-10-CM | POA: Insufficient documentation

## 2023-08-27 DIAGNOSIS — N760 Acute vaginitis: Secondary | ICD-10-CM | POA: Insufficient documentation

## 2023-08-27 DIAGNOSIS — B379 Candidiasis, unspecified: Secondary | ICD-10-CM

## 2023-08-27 DIAGNOSIS — Z3202 Encounter for pregnancy test, result negative: Secondary | ICD-10-CM | POA: Insufficient documentation

## 2023-08-27 LAB — WET PREP, GENITAL
Sperm: NONE SEEN
Trich, Wet Prep: NONE SEEN
WBC, Wet Prep HPF POC: <10 (ref <10)

## 2023-08-27 LAB — HCG, QUANTITATIVE, PREGNANCY: hCG, Beta Chain, Quant, S: <1 m[IU]/mL (ref <5)

## 2023-08-27 LAB — POCT PREGNANCY, URINE: Preg Test, Ur: NEGATIVE

## 2023-08-27 MED ORDER — TERCONAZOLE 0.4 % VA CREA: 1.0000 | TOPICAL_CREAM | Freq: Every day | VAGINAL | 0 refills | Status: DC

## 2023-08-27 MED ORDER — METRONIDAZOLE 0.75 % VA GEL: 1.0000 | Freq: Every day | VAGINAL | 0 refills | Status: DC

## 2023-08-27 NOTE — MAU Note (Signed)
..  Tracy Crawford is a 23 y.o. at [redacted]w[redacted]d here in MAU reporting: +HPT, states that she was pregnant at a [facility] in February and after that she continued to test and it was negative. She denies vb or abdominal pain but does have an increase in vaginal discharge.  LMP: 08/04/23 Pain score: 0 Vitals:   08/27/23 1213  BP: 138/73  Pulse: 68  Resp: 14  Temp: 98.3 F (36.8 C)  SpO2: 100%     FHT: na  Lab orders placed from triage:   UPT

## 2023-08-27 NOTE — MAU Provider Note (Addendum)
 Chief Complaint: Vaginal Discharge  SUBJECTIVE HPI: Tracy Crawford is a 23 y.o. G3P2002 at [redacted]w[redacted]d by LMP who presents to maternity admissions reporting positive home pregnancy test and vaginal discharge.  Patient had positive home pregnancy test today. Did have a positive test at [facility], [place] in February but subsequent home tests were negative prior to today. Increased white/thin vaginal discharge. Did try boric acid which did not relieve symptoms. Denies VB, abdominal pain, urinary symptoms, fever/chills.  HPI  Past Medical History:  Diagnosis Date   Anxiety    Asthma    non compliant with medication    Depression    Food allergy    SHELLFISH, TREE NUTS   HSV (herpes simplex virus) infection    Hypertension    PIH   Miscarriage    PONV (postoperative nausea and vomiting)    Pregnancy induced hypertension    Past Surgical History:  Procedure Laterality Date   NO PAST SURGERIES     TOOTH EXTRACTION Bilateral 03/10/2021   Procedure: DENTAL RESTORATION/EXTRACTIONS;  Surgeon: Ascencion Lava, DMD;  Location: [facility];  Service: Oral Surgery;  Laterality: Bilateral;   Social History   Socioeconomic History   Marital status: Single    Spouse name: Not on file   Number of children: 1   Years of education: Not on file   Highest education level: Not on file  Occupational History   Not on file  Tobacco Use   Smoking status: Never    Passive exposure: Never   Smokeless tobacco: Never  Vaping Use   Vaping status: Never Used  Substance and Sexual Activity   Alcohol use: No   Drug use: Not Currently    Types: Marijuana    Comment: last smoked prior to pregnancy   Sexual activity: Yes    Birth control/protection: None  Other Topics Concern   Not on file  Social History Narrative   Not on file   Social Drivers of Health   Financial Resource Strain: Not on file  Food Insecurity: No Food Insecurity (06/29/2022)   Hunger Vital Sign    Worried About Running Out of Food in  the Last Year: Never true    Ran Out of Food in the Last Year: Never true  Transportation Needs: Unmet Transportation Needs (06/29/2022)   PRAPARE - Administrator, Civil Service (Medical): No    Lack of Transportation (Non-Medical): Yes  Physical Activity: Not on file  Stress: Not on file  Social Connections: Not on file  Intimate Partner Violence: Not At Risk (06/29/2022)   Humiliation, Afraid, Rape, and Kick questionnaire    Fear of Current or Ex-Partner: No    Emotionally Abused: No    Physically Abused: No    Sexually Abused: No   No current facility-administered medications on file prior to encounter.   Current Outpatient Medications on File Prior to Encounter  Medication Sig Dispense Refill   acetaminophen  (TYLENOL ) 325 MG tablet Take 2 tablets (650 mg total) by mouth every 4 (four) hours as needed (for pain scale < 4). 30 tablet 0   albuterol  (PROVENTIL ) (2.5 MG/3ML) 0.083% nebulizer solution Take 3 mLs (2.5 mg total) by nebulization every 4 (four) hours as needed for wheezing or shortness of breath (coughing fits). 75 mL 2   albuterol  (VENTOLIN  HFA) 108 (90 Base) MCG/ACT inhaler Inhale 2 puffs into the lungs every 4 (four) hours as needed for wheezing or shortness of breath (coughing fits). 18 g 2   benzocaine -Menthol  (DERMOPLAST)  20-0.5 % AERO Apply 1 Application topically as needed for irritation (perineal discomfort). 78 g 1   famotidine  (PEPCID ) 40 MG tablet Take 1 tablet (40 mg total) by mouth every evening. 30 tablet 0   fluticasone  (FLONASE ) 50 MCG/ACT nasal spray Place 1 spray into both nostrils 2 (two) times daily as needed (nasal congestion). 16 g 11   fluticasone  (FLOVENT  HFA) 110 MCG/ACT inhaler Inhale 2 puffs into the lungs 2 (two) times daily. 1 each 11   ibuprofen  (ADVIL ) 600 MG tablet Take 1 tablet (600 mg total) by mouth every 6 (six) hours. 30 tablet 0   iron  polysaccharides (NIFEREX) 150 MG capsule Take 1 capsule (150 mg total) by mouth daily. 90  capsule 0   levocetirizine (XYZAL ) 5 MG tablet Take 1 tablet (5 mg total) by mouth every evening. 30 tablet 11   montelukast  (SINGULAIR ) 10 MG tablet Take 1 tablet (10 mg total) by mouth at bedtime. 30 tablet 11   NIFEdipine  (ADALAT  CC) 30 MG 24 hr tablet Take 1 tablet (30 mg total) by mouth daily. 30 tablet 1   ondansetron  (ZOFRAN -ODT) 8 MG disintegrating tablet Take 1 tablet (8 mg total) by mouth every 8 (eight) hours as needed for nausea or vomiting. 30 tablet 2   oxyCODONE  (OXY IR/ROXICODONE ) 5 MG immediate release tablet Take 1 tablet (5 mg total) by mouth every 4 (four) hours as needed (pain scale 4-7). 15 tablet 0   Prenatal Vit-Fe Fumarate-FA (PRENATAL MULTIVITAMIN) TABS tablet Take 1 tablet by mouth daily at 12 noon.     promethazine  (PHENERGAN ) 25 MG suppository Place 25 mg rectally every 6 (six) hours as needed for nausea or vomiting.     [DISCONTINUED] ferrous sulfate  325 (65 FE) MG tablet Take 1 tablet (325 mg total) by mouth daily with breakfast. 30 tablet 3   Allergies  Allergen Reactions   Other Anaphylaxis   Shellfish Allergy Anaphylaxis   Apple Itching   Augmentin [Amoxicillin-Pot Clavulanate] Hives    Has patient had a PCN reaction causing immediate rash, facial/tongue/throat swelling, SOB or lightheadedness with hypotension: Unknown Has patient had a PCN reaction causing severe rash involving mucus membranes or skin necrosis: Unknown Has patient had a PCN reaction that required hospitalization: Unknown Has patient had a PCN reaction occurring within the last 10 years: Yes If all of the above answers are "NO", then may proceed with Cephalosporin use.    Banana Itching   Ceftin [Cefuroxime Axetil] Hives    Unknown reaction   Cherry    Justicia Adhatoda (Malabar Nut Tree) [Justicia Adhatoda]     All nuts grown on tree   Peach [Prunus Persica] Itching   Plum Pulp Itching   Watermelon [Citrullus Vulgaris] Itching    ROS:  Pertinent positives/negatives listed  above.  I have reviewed patient's Past Medical Hx, Surgical Hx, Family Hx, Social Hx, medications and allergies.   Physical Exam  Patient Vitals for the past 24 hrs:  BP Temp Temp src Pulse Resp SpO2 Height Weight  08/27/23 1213 138/73 98.3 F (36.8 C) Oral 68 14 100 % 5\' 2"  (1.575 m) 102.6 kg   Constitutional: Well-developed, well-nourished female in no acute distress Cardiovascular: normal rate Respiratory: normal effort GI: Abd soft, non-tender. Pos BS x 4 MS: Extremities nontender, no edema, normal ROM Neurologic: Alert and oriented x 4 GU: Neg CVAT  LAB RESULTS Results for orders placed or performed during the hospital encounter of 08/27/23 (from the past 24 hours)  Wet prep, genital  Status: Abnormal   Collection Time: 08/27/23 12:27 PM  Result Value Ref Range   Yeast Wet Prep HPF POC PRESENT (A) NONE SEEN   Trich, Wet Prep NONE SEEN NONE SEEN   Clue Cells Wet Prep HPF POC PRESENT (A) NONE SEEN   WBC, Wet Prep HPF POC <10 <10   Sperm NONE SEEN   Pregnancy, urine POC     Status: None   Collection Time: 08/27/23 12:33 PM  Result Value Ref Range   Preg Test, Ur NEGATIVE NEGATIVE      IMAGING No results found.  MAU Management/MDM: Orders Placed This Encounter  Procedures   Wet prep, genital   hCG, quantitative, pregnancy   Pregnancy, urine POC   Discharge patient    Meds ordered this encounter  Medications   terconazole  (TERAZOL 7 ) 0.4 % vaginal cream    Sig: Place 1 applicator vaginally at bedtime. Use for seven days    Dispense:  45 g    Refill:  0   metroNIDAZOLE  (METROGEL ) 0.75 % vaginal gel    Sig: Place 1 Applicatorful vaginally at bedtime.    Dispense:  70 g    Refill:  0    Patient presents with home positive pregnancy test and increase in discharge. UPT negative here in MAU. As patient is not having any abdominal pain or bleeding, will obtain quantitative HCG to determine pregnancy. She would like to receive results via MyChart.  Vaginal  discharge: wet prep positive for yeast and clue cells. Will treat with terazole and metrogel , respectively. GC/C pending. Patient declines Full STI screening at this time.  ASSESSMENT 1. Negative pregnancy test   2. Vaginal discharge   3. Yeast infection   4. Bacterial vaginosis     PLAN Discharge home with strict return precautions. Allergies as of 08/27/2023       Reactions   Other Anaphylaxis   Shellfish Allergy Anaphylaxis   Apple Itching   Augmentin [amoxicillin-pot Clavulanate] Hives   Has patient had a PCN reaction causing immediate rash, facial/tongue/throat swelling, SOB or lightheadedness with hypotension: Unknown Has patient had a PCN reaction causing severe rash involving mucus membranes or skin necrosis: Unknown Has patient had a PCN reaction that required hospitalization: Unknown Has patient had a PCN reaction occurring within the last 10 years: Yes If all of the above answers are "NO", then may proceed with Cephalosporin use.   Banana Itching   Ceftin [cefuroxime Axetil] Hives   Unknown reaction   Cherry    Justicia Adhatoda (malabar Nut Tree) [justicia Adhatoda]    All nuts grown on tree   Peach [prunus Persica] Itching   Plum Pulp Itching   Watermelon [citrullus Vulgaris] Itching        Medication List     TAKE these medications    acetaminophen  325 MG tablet Commonly known as: Tylenol  Take 2 tablets (650 mg total) by mouth every 4 (four) hours as needed (for pain scale < 4).   albuterol  108 (90 Base) MCG/ACT inhaler Commonly known as: Ventolin  HFA Inhale 2 puffs into the lungs every 4 (four) hours as needed for wheezing or shortness of breath (coughing fits).   albuterol  (2.5 MG/3ML) 0.083% nebulizer solution Commonly known as: PROVENTIL  Take 3 mLs (2.5 mg total) by nebulization every 4 (four) hours as needed for wheezing or shortness of breath (coughing fits).   benzocaine -Menthol  20-0.5 % Aero Commonly known as: DERMOPLAST Apply 1 Application  topically as needed for irritation (perineal discomfort).   famotidine  40  MG tablet Commonly known as: PEPCID  Take 1 tablet (40 mg total) by mouth every evening.   fluticasone  110 MCG/ACT inhaler Commonly known as: FLOVENT  HFA Inhale 2 puffs into the lungs 2 (two) times daily.   fluticasone  50 MCG/ACT nasal spray Commonly known as: FLONASE  Place 1 spray into both nostrils 2 (two) times daily as needed (nasal congestion).   ibuprofen  600 MG tablet Commonly known as: ADVIL  Take 1 tablet (600 mg total) by mouth every 6 (six) hours.   iron  polysaccharides 150 MG capsule Commonly known as: NIFEREX Take 1 capsule (150 mg total) by mouth daily.   levocetirizine 5 MG tablet Commonly known as: XYZAL  Take 1 tablet (5 mg total) by mouth every evening.   metroNIDAZOLE  0.75 % vaginal gel Commonly known as: METROGEL  Place 1 Applicatorful vaginally at bedtime.   montelukast  10 MG tablet Commonly known as: Singulair  Take 1 tablet (10 mg total) by mouth at bedtime.   NIFEdipine  30 MG 24 hr tablet Commonly known as: ADALAT  CC Take 1 tablet (30 mg total) by mouth daily.   ondansetron  8 MG disintegrating tablet Commonly known as: ZOFRAN -ODT Take 1 tablet (8 mg total) by mouth every 8 (eight) hours as needed for nausea or vomiting.   oxyCODONE  5 MG immediate release tablet Commonly known as: Oxy IR/ROXICODONE  Take 1 tablet (5 mg total) by mouth every 4 (four) hours as needed (pain scale 4-7).   prenatal multivitamin Tabs tablet Take 1 tablet by mouth daily at 12 noon.   promethazine  25 MG suppository Commonly known as: PHENERGAN  Place 25 mg rectally every 6 (six) hours as needed for nausea or vomiting.   terconazole  0.4 % vaginal cream Commonly known as: TERAZOL 7  Place 1 applicator vaginally at bedtime. Use for seven days         Authur Leghorn, MD [facility] Fellow 08/27/2023  12:54 PM   I provided general supervision for this patient and was immediately available for any  patient care concerns.

## 2023-08-29 LAB — GC/CHLAMYDIA PROBE AMP (~~LOC~~) NOT AT ARMC
Chlamydia: NEGATIVE
Comment: NEGATIVE
Comment: NORMAL
Neisseria Gonorrhea: NEGATIVE

## 2023-09-24 ENCOUNTER — Other Ambulatory Visit: Payer: Self-pay | Admitting: Family Medicine

## 2024-01-11 ENCOUNTER — Other Ambulatory Visit: Payer: Self-pay | Admitting: Medical Genetics

## 2024-02-28 ENCOUNTER — Encounter (HOSPITAL_COMMUNITY): Payer: Self-pay | Admitting: Obstetrics and Gynecology

## 2024-02-28 ENCOUNTER — Inpatient Hospital Stay (HOSPITAL_COMMUNITY)
Admission: AD | Admit: 2024-02-28 | Discharge: 2024-02-28 | Disposition: A | Discharge status: Home / Self Care | Payer: MEDICAID | Attending: Obstetrics and Gynecology | Admitting: Obstetrics and Gynecology

## 2024-02-28 ENCOUNTER — Other Ambulatory Visit: Payer: Self-pay

## 2024-02-28 DIAGNOSIS — R109 Unspecified abdominal pain: Secondary | ICD-10-CM | POA: Insufficient documentation

## 2024-02-28 DIAGNOSIS — R030 Elevated blood-pressure reading, without diagnosis of hypertension: Secondary | ICD-10-CM

## 2024-02-28 DIAGNOSIS — O26891 Other specified pregnancy related conditions, first trimester: Secondary | ICD-10-CM

## 2024-02-28 DIAGNOSIS — Z3A01 Less than 8 weeks gestation of pregnancy: Secondary | ICD-10-CM | POA: Diagnosis not present

## 2024-02-28 NOTE — MAU Note (Signed)
 Tracy Crawford is a 23 y.o. at Unknown here in MAU reporting: couple of positive UPT at home and would like confirmation. Denies consistent abd pain, VB or LOF.  LMP: 01/30/24 Onset of complaint: none Pain score: 5/10 lower abd (mild every once in awhile.) Vitals:   02/28/24 1021  BP: (!) 140/87  Pulse: 80  Resp: 16  Temp: 97.7 F (36.5 C)  SpO2: 100%     FHT: na  Lab orders placed from triage: none

## 2024-02-28 NOTE — Discharge Instructions (Signed)
 You can contact any of the OB offices to schedule an appointment for pregnancy confirmation!  [facility] for [employer] at [employer] [facility]             [address] [place], [place] [address] [phone]  Center for [facility] at [facility]                                                             [address], [address], [place], [place], [address] [phone]  Center for [facility] at [facility] [address], [place], [place], [address] [phone]  Center for [facility] at [facility] [address], [place], [place], [address] [phone]  Center for [facility] at [facility]                                 [address], [place], [place], [address] [phone]  Center for [facility] at [facility]                                    [address], [place], [place], [address] [phone]  Center for [facility] at [facility] [address], [place], [place], [address]                              [facility] [address], [place], [place], [address] [phone]  [facility]         Phone: [phone]  [facility]      Phone: [phone]   [facility]      Phone: [phone]  [facility] Health Department-Family Planning         Phone: [phone]   [facility] Health Department-Maternity    Phone: [phone]  [facility]      Phone: [phone]  [facility]     Phone: [phone]  [facility]        Phone: [phone]  [facility]) [phone]  [facility]      Phone: [phone]

## 2024-02-28 NOTE — MAU Provider Note (Signed)
 None     S [name] is a 23 y.o. [phone] patient who presents to MAU today for pregnancy confirmation. She does not have any other complaints. She reported to RN that she has had intermittent cramps the past 4 weeks of pregnancy, but is not cramping right now. Her OBGYN is [facility].  O BP (!) 140/87 (BP Location: Right Arm)   Pulse 80   Temp 97.7 F (36.5 C) (Oral)   Resp 16   Ht 5' 2 (1.575 m)   Wt 103.8 kg   LMP 01/30/2024   SpO2 100%   BMI 41.87 kg/m  Physical Exam Vitals reviewed.  Constitutional:      General: She is not in acute distress.    Appearance: She is well-developed. She is not diaphoretic.  Eyes:     General: No scleral icterus. Pulmonary:     Effort: Pulmonary effort is normal. No respiratory distress.  Skin:    General: Skin is warm and dry.  Neurological:     Mental Status: She is alert.     Coordination: Coordination normal.     A Medical screening exam complete -Vital signs within normal limits other than mildly elevated BP. -No active complaints. There are no diagnoses linked to this encounter.   P Discharge from MAU in stable condition List of options for follow-up given to confirm pregnancy, OB list provided. Discussed that she can go to her OB ([facility]) for pregnancy confirmation or go to another OB in [place] if wanting to change OB offices. Warning signs for worsening condition that would warrant emergency follow-up discussed Patient may return to MAU as needed   Wallace Joesph LABOR, [place] 02/28/2024 10:47 AM

## 2024-03-06 ENCOUNTER — Ambulatory Visit (HOSPITAL_COMMUNITY)
Admission: EM | Admit: 2024-03-06 | Discharge: 2024-03-06 | Disposition: A | Discharge status: Home / Self Care | Payer: MEDICAID | Attending: Psychiatry | Admitting: Psychiatry

## 2024-03-06 DIAGNOSIS — Z9151 Personal history of suicidal behavior: Secondary | ICD-10-CM | POA: Insufficient documentation

## 2024-03-06 DIAGNOSIS — F250 Schizoaffective disorder, bipolar type: Secondary | ICD-10-CM | POA: Insufficient documentation

## 2024-03-06 DIAGNOSIS — O99341 Other mental disorders complicating pregnancy, first trimester: Secondary | ICD-10-CM | POA: Insufficient documentation

## 2024-03-06 DIAGNOSIS — F419 Anxiety disorder, unspecified: Secondary | ICD-10-CM | POA: Insufficient documentation

## 2024-03-06 DIAGNOSIS — R4689 Other symptoms and signs involving appearance and behavior: Secondary | ICD-10-CM

## 2024-03-06 DIAGNOSIS — O99321 Drug use complicating pregnancy, first trimester: Secondary | ICD-10-CM | POA: Insufficient documentation

## 2024-03-06 DIAGNOSIS — Z3A01 Less than 8 weeks gestation of pregnancy: Secondary | ICD-10-CM | POA: Insufficient documentation

## 2024-03-06 DIAGNOSIS — F1210 Cannabis abuse, uncomplicated: Secondary | ICD-10-CM | POA: Insufficient documentation

## 2024-03-06 DIAGNOSIS — Z818 Family history of other mental and behavioral disorders: Secondary | ICD-10-CM | POA: Insufficient documentation

## 2024-03-06 DIAGNOSIS — F429 Obsessive-compulsive disorder, unspecified: Secondary | ICD-10-CM | POA: Insufficient documentation

## 2024-03-06 DIAGNOSIS — G4700 Insomnia, unspecified: Secondary | ICD-10-CM | POA: Insufficient documentation

## 2024-03-06 NOTE — Discharge Instructions (Addendum)
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
## 2024-03-07 ENCOUNTER — Encounter (HOSPITAL_COMMUNITY): Payer: Self-pay | Admitting: Physician Assistant

## 2024-03-07 ENCOUNTER — Inpatient Hospital Stay (HOSPITAL_COMMUNITY)
Admission: AD | Admit: 2024-03-07 | Discharge: 2024-03-07 | Disposition: A | Discharge status: Home / Self Care | Payer: MEDICAID | Attending: Obstetrics & Gynecology | Admitting: Obstetrics & Gynecology

## 2024-03-07 ENCOUNTER — Telehealth: Payer: Self-pay | Admitting: Family Medicine

## 2024-03-07 ENCOUNTER — Ambulatory Visit (INDEPENDENT_AMBULATORY_CARE_PROVIDER_SITE_OTHER): Payer: MEDICAID | Admitting: Physician Assistant

## 2024-03-07 VITALS — BP 157/96 | HR 66 | Temp 98.1°F | Ht 62.0 in | Wt 230.4 lb

## 2024-03-07 DIAGNOSIS — Z758 Other problems related to medical facilities and other health care: Secondary | ICD-10-CM

## 2024-03-07 DIAGNOSIS — O10011 Pre-existing essential hypertension complicating pregnancy, first trimester: Secondary | ICD-10-CM

## 2024-03-07 DIAGNOSIS — F411 Generalized anxiety disorder: Secondary | ICD-10-CM

## 2024-03-07 DIAGNOSIS — Z3A01 Less than 8 weeks gestation of pregnancy: Secondary | ICD-10-CM | POA: Diagnosis not present

## 2024-03-07 DIAGNOSIS — F250 Schizoaffective disorder, bipolar type: Secondary | ICD-10-CM

## 2024-03-07 DIAGNOSIS — R519 Headache, unspecified: Secondary | ICD-10-CM | POA: Diagnosis not present

## 2024-03-07 DIAGNOSIS — O09291 Supervision of pregnancy with other poor reproductive or obstetric history, first trimester: Secondary | ICD-10-CM | POA: Insufficient documentation

## 2024-03-07 DIAGNOSIS — R030 Elevated blood-pressure reading, without diagnosis of hypertension: Secondary | ICD-10-CM | POA: Diagnosis present

## 2024-03-07 LAB — URINALYSIS, ROUTINE W REFLEX MICROSCOPIC
Bacteria, UA: NONE SEEN
Bilirubin Urine: NEGATIVE
Glucose, UA: NEGATIVE mg/dL
Ketones, ur: 20 mg/dL — AB
Leukocytes,Ua: NEGATIVE
Nitrite: NEGATIVE
Protein, ur: 30 mg/dL — AB
Specific Gravity, Urine: 1.028 (ref 1.005–1.030)
pH: 5 (ref 5.0–8.0)

## 2024-03-07 MED ORDER — NIFEDIPINE ER OSMOTIC RELEASE 30 MG PO TB24: 30.0000 mg | ORAL_TABLET | Freq: Every day | ORAL | 0 refills | Status: AC
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
## 2024-03-07 NOTE — Discharge Instructions (Addendum)

## 2024-03-07 NOTE — MAU Provider Note (Signed)
 S [name] is a 23 y.o. [phone] patient who presents to MAU today with complaint of sent from [facility] today due to an elevated BP reading of  (157/102) per patient report.  Patient states she is also reporting a mild headache but has not taken any medication for her headache and has declined medication offered here today in the MAU.  She denies any visual disturbances, right upper quadrant pain, or history of high blood pressure with this pregnancy which is dated at 5 weeks 2 days.    Patient did report that her prior prenatal care has been received at [facility] and that she had preeclampsia with her prior pregnancies.  She reports that she was at the med center today trying to determine where she would like to proceed with her prenatal care.  Currently she has an appointment at [facility] on 03/26/2024 for new OB appointment as well as the Med Center on 04/02/2024.    O BP (!) 145/84 (BP Location: Right Arm)   Pulse 81   Temp 98.4 F (36.9 C)   Resp 16   Ht 5' 2 (1.575 m)   Wt 105.1 kg   LMP 01/30/2024   SpO2 99%   BMI 42.36 kg/m  Physical Exam Vitals and nursing note reviewed.  Constitutional:      General: She is not in acute distress.    Appearance: Normal appearance. She is obese. She is ill-appearing.  HENT:     Head: Normocephalic.     Nose: Nose normal.     Mouth/Throat:     Mouth: Mucous membranes are moist.  Cardiovascular:     Rate and Rhythm: Normal rate.  Pulmonary:     Effort: Pulmonary effort is normal.  Abdominal:     Palpations: Abdomen is soft.     Tenderness: There is no abdominal tenderness.  Musculoskeletal:        General: Normal range of motion.     Cervical back: Normal range of motion.  Skin:    General: Skin is warm.  Neurological:     Mental Status: She is alert and oriented to person, place, and time.       MDM  MODERATE   Elevated BP's in early pregnancy consistent with a diagnosis of essential hypertension or  chronic.  This is based on the fact that the patient has had elevated blood pressures prior to 20 weeks of pregnancy.    Documented on 02/28/2024 and 03/07/2024 are mild range blood pressures. Based on the patient's history and newly diagnosed chronic hypertension it is advised that she start an antihypertensive.  Therefore prescription for Procardia  30 XL will be sent to pharmacy of choice.  Patient needs to have a follow-up blood pressure check in 1 week to see if adjustments are needed.  Secure message sent to the Med Center for follow-up BP check in 1 week which is where she was seen today initially.     Differential Diagnosis of Hypertension in pregnancy can be preexisting chronic hypertension, Gestational hypertension, preeclampsia, preeclampsia with Severe features, Chronic hypertension with superimposed Preeclampsia, HELLP Syndrome, all of these conditions can lead to Eclampsia, pulmonary edema, IUGR, and/or placenta abruption. Precise diagnosis is often challenging. High clinical suspicion is warranted given the increase in maternal and fetal-neonatal risks associated with preeclampsia.   Orders Placed This Encounter  Procedures   Urinalysis, Routine w reflex microscopic -Urine, Clean Catch    Standing Status:   Standing    Number  of Occurrences:   1    Specimen Source:   Urine, Clean Catch [76]   Discharge patient Discharge disposition: 01-Home or Self Care; Discharge patient date: 03/07/2024    Standing Status:   Standing    Number of Occurrences:   1    Discharge disposition:   01-Home or Self Care [1]    Discharge patient date:   03/07/2024      Results for orders placed or performed during the hospital encounter of 03/07/24 (from the past 24 hours)  Urinalysis, Routine w reflex microscopic -Urine, Clean Catch     Status: Abnormal   Collection Time: 03/07/24 11:28 AM  Result Value Ref Range   Color, Urine YELLOW YELLOW   APPearance CLEAR CLEAR   Specific Gravity, Urine 1.028  1.005 - 1.030   pH 5.0 5.0 - 8.0   Glucose, UA NEGATIVE NEGATIVE mg/dL   Hgb urine dipstick SMALL (A) NEGATIVE   Bilirubin Urine NEGATIVE NEGATIVE   Ketones, ur 20 (A) NEGATIVE mg/dL   Protein, ur 30 (A) NEGATIVE mg/dL   Nitrite NEGATIVE NEGATIVE   Leukocytes,Ua NEGATIVE NEGATIVE   RBC / HPF 6-10 0 - 5 RBC/hpf   WBC, UA 0-5 0 - 5 WBC/hpf   Bacteria, UA NONE SEEN NONE SEEN   Squamous Epithelial / HPF 0-5 0 - 5 /HPF   Mucus PRESENT        I have reviewed the patient chart and performed the physical exam . I have ordered & interpreted the lab results and reviewed them with the patient Medications ordered as stated below.  A/P as described below.  Counseling and education provided and patient agreeable  with plan as described below. Verbalized understanding.    ASSESSMENT Medical screening exam complete Essential hypertension affecting pregnancy in first trimester  [redacted] weeks gestation of pregnancy  Nonintractable headache, unspecified chronicity pattern, unspecified headache type    PLAN  Allergies as of 03/07/2024       Reactions   Other Anaphylaxis   Shellfish Allergy Anaphylaxis   Apple Itching   Augmentin [amoxicillin-pot Clavulanate] Hives   Has patient had a PCN reaction causing immediate rash, facial/tongue/throat swelling, SOB or lightheadedness with hypotension: Unknown Has patient had a PCN reaction causing severe rash involving mucus membranes or skin necrosis: Unknown Has patient had a PCN reaction that required hospitalization: Unknown Has patient had a PCN reaction occurring within the last 10 years: Yes If all of the above answers are NO, then may proceed with Cephalosporin use.   Banana Itching   Ceftin [cefuroxime Axetil] Hives   Unknown reaction   Cherry    Justicia Adhatoda (malabar Nut Tree) [justicia Adhatoda]    All nuts grown on tree   Peach [prunus Persica] Itching   Plum Pulp Itching   Watermelon [citrullus Vulgaris] Itching         Medication List     TAKE these medications    NIFEdipine  30 MG 24 hr tablet Commonly known as: PROCARDIA -XL/NIFEDICAL-XL Take 1 tablet (30 mg total) by mouth daily.        Future Appointments  Date Time Provider Department Center  03/22/2024 10:15 AM [facility] INTAKE [facility]  04/03/2024  9:15 AM Eveline Lynwood MATSU, MD [facility]  04/20/2024 10:00 AM Izella Ismael NOVAK, MD [facility] None    Discharge from MAU in stable condition  BP check in 1 week  See AVS for full description of educational information and instructions provided to the patient at time of discharge  Warning signs for worsening condition that would warrant emergency follow-up discussed  Patient may return to MAU as needed   Littie Olam LABOR, NP 03/07/2024 12:56 PM   This chart was dictated using voice recognition software, Dragon. Despite the best efforts of this provider to proofread and correct errors, errors may still occur which can change documentation meaning.

## 2024-03-07 NOTE — MAU Note (Signed)
 Tracy Crawford is a 23 y.o. at [redacted]w[redacted]d here in MAU reporting: HA and HP in office (157/102). Denies visual disturbances or RUQ pain. Hx of high BP with pregnancy.   LMP: 01/26/24 Onset of complaint: at office Pain score: 5/10- no meds Vitals:   03/07/24 1108  BP: (!) 145/84  Pulse: 81  Resp: 16  Temp: 98.4 F (36.9 C)  SpO2: 99%     FHT: na  Lab orders placed from triage: ua

## 2024-03-07 NOTE — Telephone Encounter (Signed)
 Patient stopped by today to get scheduled for an OB Intake and a New Ob appointment to establish care. During our conversation she mentioned that she is having high blood pressure but reports no symptoms. After communicating with one of our nurses, Diane, she suggested for the patient to report to the MAU for a full assessment. Patient agreed to stopped by MAU per her suggestion.

## 2024-03-08 ENCOUNTER — Other Ambulatory Visit: Payer: Self-pay | Admitting: Nurse Practitioner

## 2024-03-08 NOTE — Telephone Encounter (Signed)
 30 day supply only at this time since dosing may need to be adjusted/and or changed

## 2024-03-14 ENCOUNTER — Encounter: Payer: Self-pay | Admitting: *Deleted

## 2024-03-14 ENCOUNTER — Telehealth: Payer: Self-pay | Admitting: Family Medicine

## 2024-03-14 ENCOUNTER — Ambulatory Visit: Payer: MEDICAID

## 2024-03-14 ENCOUNTER — Other Ambulatory Visit: Payer: Self-pay

## 2024-03-14 VITALS — BP 129/73 | HR 69 | Ht 62.0 in | Wt 230.5 lb

## 2024-03-14 DIAGNOSIS — Z0130 Encounter for examination of blood pressure without abnormal findings: Secondary | ICD-10-CM

## 2024-03-14 NOTE — Progress Notes (Cosign Needed Addendum)
 Tracy Crawford is here today for Blood pressure check. Patient seen at [facility] 11/05 for elevated BP's. She is currently [redacted]w[redacted]d.  Patient states she has pink when wiping.  Patient states she has not taken the prescribed Procardia  30 mg and she will pick that medication up today. BP- 129/73. Patient denies headache, vision changes and dizziness.   Reviewed chart with provider. Advised the patient to being taking the medication as prescribed. She voiced understanding. Reviewed MAU precautions with patient and she voiced understanding.  Patient states she has no further questions or concerns.  Devon, RN 03/14/24

## 2024-03-14 NOTE — Telephone Encounter (Signed)
 Called patient to confirm if she would like to reschedule her intake appointment. Patient cancelled her appointment via MyChart so we had to cancel the New Ob appointment. I left a detailed message with our call back number in case she would like to reschedule.

## 2024-03-21 ENCOUNTER — Telehealth: Payer: Self-pay | Admitting: Family Medicine

## 2024-03-21 NOTE — Telephone Encounter (Signed)
 Second attempt to call patient to verify if she has decided to receive care at a different facility. Patient didn't answer call so I left a message asking if she needs to reschedule to please let us  know so that we can get her on our schedule.

## 2024-03-22 ENCOUNTER — Telehealth: Payer: MEDICAID

## 2024-04-03 ENCOUNTER — Encounter: Payer: MEDICAID | Admitting: Obstetrics & Gynecology
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
## 2024-04-20 ENCOUNTER — Encounter (HOSPITAL_COMMUNITY): Payer: MEDICAID | Admitting: Psychiatry

## 2024-05-01 ENCOUNTER — Inpatient Hospital Stay (HOSPITAL_COMMUNITY)
Admission: AD | Admit: 2024-05-01 | Discharge: 2024-05-01 | Disposition: A | Discharge status: Home / Self Care | Payer: MEDICAID | Attending: Obstetrics and Gynecology | Admitting: Obstetrics and Gynecology

## 2024-05-01 ENCOUNTER — Other Ambulatory Visit: Payer: Self-pay

## 2024-05-01 DIAGNOSIS — Z3492 Encounter for supervision of normal pregnancy, unspecified, second trimester: Secondary | ICD-10-CM

## 2024-05-01 DIAGNOSIS — O219 Vomiting of pregnancy, unspecified: Secondary | ICD-10-CM | POA: Diagnosis not present

## 2024-05-01 DIAGNOSIS — E860 Dehydration: Secondary | ICD-10-CM | POA: Diagnosis not present

## 2024-05-01 DIAGNOSIS — Z3A13 13 weeks gestation of pregnancy: Secondary | ICD-10-CM

## 2024-05-01 DIAGNOSIS — O09291 Supervision of pregnancy with other poor reproductive or obstetric history, first trimester: Secondary | ICD-10-CM | POA: Insufficient documentation

## 2024-05-01 DIAGNOSIS — O99281 Endocrine, nutritional and metabolic diseases complicating pregnancy, first trimester: Secondary | ICD-10-CM | POA: Insufficient documentation

## 2024-05-01 LAB — URINALYSIS, ROUTINE W REFLEX MICROSCOPIC
Bilirubin Urine: NEGATIVE
Glucose, UA: NEGATIVE mg/dL
Ketones, ur: NEGATIVE mg/dL
Leukocytes,Ua: NEGATIVE
Nitrite: NEGATIVE
Protein, ur: 30 mg/dL — AB
Specific Gravity, Urine: 1.029 (ref 1.005–1.030)
pH: 5 (ref 5.0–8.0)

## 2024-05-01 MED ORDER — ONDANSETRON 4 MG PO TBDP: 4.0000 mg | ORAL_TABLET | Freq: Four times a day (QID) | ORAL | 0 refills | Status: AC | PRN

## 2024-05-01 NOTE — MAU Provider Note (Signed)
 " History     CSN: [id number]  Arrival date and time: 05/01/24 1240   Event Date/Time   First Provider Initiated Contact with Patient 05/01/24 1420      Chief Complaint  Patient presents with   Headache   Nausea   Emesis   Tracy Crawford is a G4P2002 at [redacted]w[redacted]d present due to reported dehydration, racing heart rate.  - last vomited yesterday, drinking water well - is able to keep down fluids but feels she can't keep up and is worried about amber urine - She reports this is like food poisoning and having vomiting with some loose stools - Denies fevers, chills, infectious/sick contacts - feels like her normal N/V in pregnancy - Reports history of hyperemesis gravidarum in prior pregnancy that was worse - Reports history of previa in prior pregnancy that resolved  Headache  Associated symptoms include vomiting. Pertinent negatives include no abdominal pain, back pain, coughing, dizziness, eye pain, fever, nausea or sore throat.  Emesis  Associated symptoms include headaches. Pertinent negatives include no abdominal pain, chest pain, chills, coughing, diarrhea, dizziness or fever.    OB History     Gravida  4   Para  2   Term  2   Preterm      AB  0   Living  2      SAB  0   IAB      Ectopic      Multiple  0   Live Births  2           Past Medical History:  Diagnosis Date   Anxiety    Asthma    non compliant with medication    Depression    Food allergy    SHELLFISH, TREE NUTS   HSV (herpes simplex virus) infection    Hypertension    PIH   Miscarriage    PONV (postoperative nausea and vomiting)    Pregnancy induced hypertension     Past Surgical History:  Procedure Laterality Date   NO PAST SURGERIES     TOOTH EXTRACTION Bilateral 03/10/2021   Procedure: DENTAL RESTORATION/EXTRACTIONS;  Surgeon: Sheryle Hamilton, DMD;  Location: [facility];  Service: Oral Surgery;  Laterality: Bilateral;    Family History  Problem Relation Age of  Onset   Asthma Father    Asthma Maternal Aunt    Hypertension Maternal Uncle    Obesity Maternal Grandmother    Hypertension Maternal Grandmother    Asthma Paternal Grandmother    Allergic rhinitis Neg Hx    Angioedema Neg Hx    Immunodeficiency Neg Hx    Eczema Neg Hx    Urticaria Neg Hx     Social History[1]  Allergies: Allergies[2]  Medications Prior to Admission  Medication Sig Dispense Refill Last Dose/Taking   SYMBICORT  160-4.5 MCG/ACT inhaler SMARTSIG:2 Puff(s) By Mouth Twice Daily   05/01/2024   NIFEdipine  (PROCARDIA -XL/NIFEDICAL-XL) 30 MG 24 hr tablet Take 1 tablet (30 mg total) by mouth daily. (Patient not taking: Reported on 03/14/2024) 30 tablet 0     Review of Systems  Constitutional:  Negative for chills and fever.  HENT:  Negative for congestion and sore throat.   Eyes:  Negative for pain and visual disturbance.  Respiratory:  Negative for cough, chest tightness and shortness of breath.   Cardiovascular:  Negative for chest pain.  Gastrointestinal:  Positive for vomiting. Negative for abdominal pain, diarrhea and nausea.  Endocrine: Negative for cold intolerance and heat intolerance.  Genitourinary:  Negative for dysuria and flank pain.  Musculoskeletal:  Negative for back pain.  Skin:  Negative for rash.  Allergic/Immunologic: Negative for food allergies.  Neurological:  Positive for headaches. Negative for dizziness and light-headedness.  Psychiatric/Behavioral:  Negative for agitation.    Physical Exam   Blood pressure 135/83, pulse 77, temperature 97.7 F (36.5 C), temperature source Oral, resp. rate 18, height 5' 2.5 (1.588 m), weight 101.2 kg, last menstrual period 01/30/2024, SpO2 100%, unknown if currently breastfeeding.  Physical Exam Vitals and nursing note reviewed.  Constitutional:      Appearance: Normal appearance.  HENT:     Head: Normocephalic and atraumatic.     Nose: Nose normal.     Mouth/Throat:     Mouth: Mucous membranes are  moist.  Eyes:     Conjunctiva/sclera: Conjunctivae normal.  Cardiovascular:     Rate and Rhythm: Normal rate.  Pulmonary:     Effort: Pulmonary effort is normal.  Abdominal:     General: Abdomen is flat.     Palpations: Abdomen is soft.  Musculoskeletal:     Cervical back: Normal range of motion.  Skin:    General: Skin is warm.     Capillary Refill: Capillary refill takes less than 2 seconds.  Neurological:     General: No focal deficit present.     Mental Status: She is alert.  Psychiatric:        Mood and Affect: Mood normal.     MAU Course  Procedures    MDM: moderate  This patient presents to the ED for concern of   Chief Complaint  Patient presents with   Headache   Nausea   Emesis     These complains involves an extensive number of treatment options, and is a complaint that carries with it a high risk of complications and morbidity.  The differential diagnosis for  1. Vomiting in pregnancy INCLUDES infectious causes (less likely due to lack of infectious sx like fever/chills and otherwise normal vital signs, most likely normal variant. No signs of profound dehydration on exam.   Co morbidities that complicate the patient evaluation:  Patient Active Problem List   Diagnosis Date Noted   Does not have primary care provider 03/07/2024   Schizoaffective disorder, bipolar type (HCC) 03/07/2024   Generalized anxiety disorder 03/07/2024   Gestational hypertension 06/29/2022   Severe persistent asthma with (acute) exacerbation (HCC) 08/24/2021   Second hand tobacco smoke exposure 08/24/2021   Suicide attempt (HCC) 01/19/2018   Herpes genitalis in women 01/19/2018   MDD (major depressive disorder), severe (HCC) 05/11/2017   Anaphylactic reaction due to food, subsequent encounter 11/20/2015    I ordered, and personally interpreted labs.  The pertinent results include:   Results for orders placed or performed during the hospital encounter of 05/01/24 (from the  past 24 hours)  Urinalysis, Routine w reflex microscopic -Urine, Clean Catch     Status: Abnormal   Collection Time: 05/01/24 12:53 PM  Result Value Ref Range   Color, Urine AMBER (A) YELLOW   APPearance HAZY (A) CLEAR   Specific Gravity, Urine 1.029 1.005 - 1.030   pH 5.0 5.0 - 8.0   Glucose, UA NEGATIVE NEGATIVE mg/dL   Hgb urine dipstick SMALL (A) NEGATIVE   Bilirubin Urine NEGATIVE NEGATIVE   Ketones, ur NEGATIVE NEGATIVE mg/dL   Protein, ur 30 (A) NEGATIVE mg/dL   Nitrite NEGATIVE NEGATIVE   Leukocytes,Ua NEGATIVE NEGATIVE   RBC / HPF 6-10 0 - 5 RBC/hpf  WBC, UA 0-5 0 - 5 WBC/hpf   Bacteria, UA RARE (A) NONE SEEN   Squamous Epithelial / HPF 6-10 0 - 5 /HPF   Mucus PRESENT    Hyaline Casts, UA PRESENT     EKG ordered due to heart concern- WNL, normal sinus   MAU Course:  After the interventions noted above, I reevaluated the patient and found that they have :improved  Dispostion: discharged  Assessment and Plan   1. Nausea and vomiting during pregnancy prior to [redacted] weeks gestation   2. [redacted] weeks gestation of pregnancy   3. Viable pregnancy in second trimester    -Discharged home stable condition - Rx for zofran  sent to pharmacy - Continue with routine care- plans on care at [facility]  Future Appointments  Date Time Provider Department Center  05/11/2024 10:30 AM Izella Ismael NOVAK, MD [facility] None    [facility] 05/01/2024, 2:37 PM     [1]  Social History Tobacco Use   Smoking status: Never    Passive exposure: Never   Smokeless tobacco: Never  Vaping Use   Vaping status: Never Used  Substance Use Topics   Alcohol use: No   Drug use: Not Currently    Types: Marijuana    Comment: last smoked prior to pregnancy  [2]  Allergies Allergen Reactions   Other Anaphylaxis   Shellfish Allergy Anaphylaxis   Apple Itching   Augmentin [Amoxicillin-Pot Clavulanate] Hives    Has patient had a PCN reaction causing immediate rash, facial/tongue/throat swelling,  SOB or lightheadedness with hypotension: Unknown Has patient had a PCN reaction causing severe rash involving mucus membranes or skin necrosis: Unknown Has patient had a PCN reaction that required hospitalization: Unknown Has patient had a PCN reaction occurring within the last 10 years: Yes If all of the above answers are NO, then may proceed with Cephalosporin use.    Banana Itching   Ceftin [Cefuroxime Axetil] Hives    Unknown reaction   Cherry    Justicia Adhatoda (Malabar Nut Tree) [Justicia Adhatoda]     All nuts grown on tree   Peach [Prunus Persica] Itching   Plum Pulp Itching   Watermelon [Citrullus Vulgaris] Itching   "

## 2024-05-01 NOTE — Progress Notes (Signed)
 Dr. Eldonna into perform ultrasound.  Fetal cardiac noted, FHR 169 bpm

## 2024-05-01 NOTE — MAU Note (Signed)
 Tracy Crawford is a 23 y.o. at [redacted]w[redacted]d here in MAU reporting: she thinks she's severely dehydrated.  States she  needs fluids because I can't catch up.  States the [place] needs fluids is because her pee is amber, she's vomiting, has a HA,  light headed, and  constant heart racing.  Denies VB.  LMP: 01/30/2024 Onset of complaint: 1-2 weeks Pain score: 7 Vitals:   05/01/24 1353  BP: 135/83  Pulse: 77  Resp: 18  Temp: 97.7 F (36.5 C)  SpO2: 100%     FHT: unable to hear FHT, will request provider use US    Lab orders placed from triage: UA

## 2024-05-01 NOTE — Discharge Instructions (Addendum)
 You were seen for nausea and vomiting You were not severe dehydrated on exam Your pregnancy appeared normal on a bedside ultrasound  I recommend small frequent meal to help your symptoms  A prescription for zofran  was sent to your pharmacy.   You can try the following over the counter medications to help your symptoms.   Safe Medications in Pregnancy   Nausea/Vomiting:  Bonine  Dramamine  Emetrol  Ginger extract  Sea bands  Meclizine  Nausea medication to take during pregnancy:  Unisom (doxylamine succinate 25 mg tablets) Take one tablet daily at bedtime. If symptoms are not adequately controlled, the dose can be increased to a maximum recommended dose of two tablets daily (1/2 tablet in the morning, 1/2 tablet mid-afternoon and one at bedtime).  Vitamin B6 100mg  tablets. Take one tablet twice a day (up to 200 mg per day).
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
## 2024-05-09 ENCOUNTER — Inpatient Hospital Stay (HOSPITAL_COMMUNITY): Payer: MEDICAID

## 2024-05-09 ENCOUNTER — Inpatient Hospital Stay (HOSPITAL_COMMUNITY)
Admission: AD | Admit: 2024-05-09 | Discharge: 2024-05-09 | Disposition: A | Discharge status: Home / Self Care | Payer: MEDICAID | Attending: Obstetrics and Gynecology | Admitting: Obstetrics and Gynecology

## 2024-05-09 ENCOUNTER — Other Ambulatory Visit: Payer: Self-pay | Admitting: Nurse Practitioner

## 2024-05-09 ENCOUNTER — Other Ambulatory Visit: Payer: Self-pay

## 2024-05-09 ENCOUNTER — Encounter (HOSPITAL_COMMUNITY): Payer: Self-pay | Admitting: Obstetrics and Gynecology

## 2024-05-09 DIAGNOSIS — J45901 Unspecified asthma with (acute) exacerbation: Secondary | ICD-10-CM

## 2024-05-09 DIAGNOSIS — Z3A14 14 weeks gestation of pregnancy: Secondary | ICD-10-CM

## 2024-05-09 DIAGNOSIS — O10912 Unspecified pre-existing hypertension complicating pregnancy, second trimester: Secondary | ICD-10-CM | POA: Diagnosis not present

## 2024-05-09 DIAGNOSIS — O99512 Diseases of the respiratory system complicating pregnancy, second trimester: Secondary | ICD-10-CM | POA: Insufficient documentation

## 2024-05-09 DIAGNOSIS — Z7951 Long term (current) use of inhaled steroids: Secondary | ICD-10-CM | POA: Insufficient documentation

## 2024-05-09 DIAGNOSIS — Z7982 Long term (current) use of aspirin: Secondary | ICD-10-CM | POA: Diagnosis not present

## 2024-05-09 DIAGNOSIS — Z79899 Other long term (current) drug therapy: Secondary | ICD-10-CM | POA: Insufficient documentation

## 2024-05-09 DIAGNOSIS — O09292 Supervision of pregnancy with other poor reproductive or obstetric history, second trimester: Secondary | ICD-10-CM | POA: Insufficient documentation

## 2024-05-09 DIAGNOSIS — O26892 Other specified pregnancy related conditions, second trimester: Secondary | ICD-10-CM | POA: Diagnosis not present

## 2024-05-09 DIAGNOSIS — O10919 Unspecified pre-existing hypertension complicating pregnancy, unspecified trimester: Secondary | ICD-10-CM

## 2024-05-09 DIAGNOSIS — R062 Wheezing: Secondary | ICD-10-CM

## 2024-05-09 LAB — COMPREHENSIVE METABOLIC PANEL WITH GFR
ALT: 31 U/L (ref 0–44)
AST: 21 U/L (ref 15–41)
Albumin: 4.1 g/dL (ref 3.5–5.0)
Alkaline Phosphatase: 56 U/L (ref 38–126)
Anion gap: 11 (ref 5–15)
BUN: 7 mg/dL (ref 6–20)
CO2: 22 mmol/L (ref 22–32)
Calcium: 9.4 mg/dL (ref 8.9–10.3)
Chloride: 103 mmol/L (ref 98–111)
Creatinine, Ser: 0.59 mg/dL (ref 0.44–1.00)
GFR, Estimated: >60 mL/min
Glucose, Bld: 82 mg/dL (ref 70–99)
Potassium: 3.8 mmol/L (ref 3.5–5.1)
Sodium: 136 mmol/L (ref 135–145)
Total Bilirubin: 0.4 mg/dL (ref 0.0–1.2)
Total Protein: 7.2 g/dL (ref 6.5–8.1)

## 2024-05-09 LAB — CBC
HCT: 35 % — ABNORMAL LOW (ref 36.0–46.0)
Hemoglobin: 11.4 g/dL — ABNORMAL LOW (ref 12.0–15.0)
MCH: 24.9 pg — ABNORMAL LOW (ref 26.0–34.0)
MCHC: 32.6 g/dL (ref 30.0–36.0)
MCV: 76.6 fL — ABNORMAL LOW (ref 80.0–100.0)
Platelets: 400 K/uL (ref 150–400)
RBC: 4.57 MIL/uL (ref 3.87–5.11)
RDW: 15.9 % — ABNORMAL HIGH (ref 11.5–15.5)
WBC: 14 K/uL — ABNORMAL HIGH (ref 4.0–10.5)
nRBC: 0 % (ref 0.0–0.2)

## 2024-05-09 LAB — URINALYSIS, ROUTINE W REFLEX MICROSCOPIC
Bacteria, UA: NONE SEEN
Bilirubin Urine: NEGATIVE
Glucose, UA: NEGATIVE mg/dL
Ketones, ur: NEGATIVE mg/dL
Leukocytes,Ua: NEGATIVE
Nitrite: NEGATIVE
Protein, ur: NEGATIVE mg/dL
Specific Gravity, Urine: 1.02 (ref 1.005–1.030)
pH: 6 (ref 5.0–8.0)

## 2024-05-09 LAB — RESP PANEL BY RT-PCR (RSV, FLU A&B, COVID)  RVPGX2
Influenza A by PCR: NEGATIVE
Influenza B by PCR: NEGATIVE
Resp Syncytial Virus by PCR: NEGATIVE
SARS Coronavirus 2 by RT PCR: NEGATIVE

## 2024-05-09 LAB — PRO BRAIN NATRIURETIC PEPTIDE: Pro Brain Natriuretic Peptide: 63.4 pg/mL

## 2024-05-09 LAB — PROTEIN / CREATININE RATIO, URINE
Creatinine, Urine: 144 mg/dL
Protein Creatinine Ratio: 0.1 mg/mg
Total Protein, Urine: 10 mg/dL

## 2024-05-09 MED ORDER — ALBUTEROL SULFATE HFA 108 (90 BASE) MCG/ACT IN AERS
2.0000 | INHALATION_SPRAY | Freq: Once | RESPIRATORY_TRACT | Status: AC
  Administered 2024-05-09: 2 via RESPIRATORY_TRACT
  Filled 2024-05-09: qty 6.7

## 2024-05-09 MED ORDER — ALBUTEROL SULFATE HFA 108 (90 BASE) MCG/ACT IN AERS
2.0000 | INHALATION_SPRAY | Freq: Once | RESPIRATORY_TRACT | Status: DC
  Filled 2024-05-09: qty 6.7

## 2024-05-09 MED ORDER — MONTELUKAST SODIUM 10 MG PO TABS: 10.0000 mg | ORAL_TABLET | Freq: Every day | ORAL | 0 refills | Status: DC

## 2024-05-09 MED ORDER — SYMBICORT 160-4.5 MCG/ACT IN AERO: 2.0000 | INHALATION_SPRAY | Freq: Every day | RESPIRATORY_TRACT | 2 refills | Status: AC

## 2024-05-09 MED ORDER — ASPIRIN 81 MG PO TBEC: 81.0000 mg | DELAYED_RELEASE_TABLET | Freq: Every day | ORAL | 2 refills | Status: AC

## 2024-05-09 MED ORDER — IPRATROPIUM-ALBUTEROL 0.5-2.5 (3) MG/3ML IN SOLN
3.0000 mL | Freq: Once | RESPIRATORY_TRACT | Status: AC
  Administered 2024-05-09: 3 mL via RESPIRATORY_TRACT
  Filled 2024-05-09: qty 3

## 2024-05-09 MED ORDER — PREDNISONE 20 MG PO TABS: 40.0000 mg | ORAL_TABLET | Freq: Every day | ORAL | 0 refills | Status: AC

## 2024-05-09 MED ORDER — NIFEDIPINE ER OSMOTIC RELEASE 30 MG PO TB24
30.0000 mg | ORAL_TABLET | Freq: Once | ORAL | Status: AC
  Administered 2024-05-09: 30 mg via ORAL
  Filled 2024-05-09: qty 1

## 2024-05-09 MED ORDER — ALBUTEROL SULFATE HFA 108 (90 BASE) MCG/ACT IN AERS: 2.0000 | INHALATION_SPRAY | RESPIRATORY_TRACT | 1 refills | Status: AC | PRN

## 2024-05-09 MED ORDER — PREDNISONE 20 MG PO TABS
40.0000 mg | ORAL_TABLET | Freq: Once | ORAL | Status: AC
  Administered 2024-05-09: 40 mg via ORAL
  Filled 2024-05-09: qty 2

## 2024-05-09 MED ORDER — LACTATED RINGERS IV BOLUS
1000.0000 mL | Freq: Once | INTRAVENOUS | Status: AC
  Administered 2024-05-09: 1000 mL via INTRAVENOUS

## 2024-05-09 MED ORDER — MAGNESIUM SULFATE 2 GM/50ML IV SOLN
2.0000 g | Freq: Once | INTRAVENOUS | Status: AC
  Administered 2024-05-09: 2 g via INTRAVENOUS
  Filled 2024-05-09: qty 50

## 2024-05-09 MED ORDER — METHYLPREDNISOLONE SODIUM SUCC 125 MG IJ SOLR
125.0000 mg | Freq: Once | INTRAMUSCULAR | Status: AC
  Administered 2024-05-09: 125 mg via INTRAVENOUS
  Filled 2024-05-09: qty 2

## 2024-05-09 MED ORDER — ALBUTEROL SULFATE (2.5 MG/3ML) 0.083% IN NEBU
2.5000 mg | INHALATION_SOLUTION | Freq: Once | RESPIRATORY_TRACT | Status: DC
  Filled 2024-05-09: qty 3

## 2024-05-09 NOTE — MAU Provider Note (Signed)
 Chief Complaint:  Asthma (Back to back attacks), Cough (Up blood), and Chest Pain   HPI    Tracy Crawford is a 24 y.o. [id number] at [redacted]w[redacted]d who presents to maternity admissions reporting patient presented this morning to the MAU with an acute asthma attack.  Stating that she was having difficulty breathing over the last week and that she woke up wheezing with shallow breathing.  Patient reports she is on Symbicort  160/40 dosing is 2 puffs daily.  Patient states over the last week she has been using it 5-6 times a day and has not been able to get a refill.  Patient reports she does not have a current pulmonologist nor PCP that manages her asthma.  She states she also woke up this morning with her chest hurting, cough that has been shallow and had some streaks of blood with mucus when she coughed.    She is also a chronic hypertensive that was recently started on Procardia  30 XL daily for chronic hypertension on 03/07/2024 and she has not began this medication.   Patient reports that her OB is [facility] and that they have previously managed her asthma in the past and have refilled her prescriptions for Symbicort   She offers no OB complaints at this time.  She denies any vaginal bleeding, leaking of fluid, dysuria, vaginal itching burning or irritation..   Pregnancy Course: [facility]  Past Medical History:  Diagnosis Date   Anxiety    Asthma    non compliant with medication    Depression    Food allergy    SHELLFISH, TREE NUTS   HSV (herpes simplex virus) infection    Hypertension    PIH   Miscarriage    PONV (postoperative nausea and vomiting)    Pregnancy induced hypertension    OB History  Gravida Para Term Preterm AB Living  4 2 2   0 2  SAB IAB Ectopic Multiple Live Births  0   0 2    # Outcome Date GA Lbr Len/2nd Weight Sex Type Anes PTL Lv  4 Current           3 Term 06/30/22 [redacted]w[redacted]d / 00:09 3510 g F Vag-Spont EPI  LIV  2 Term 02/26/18 [redacted]w[redacted]d  3459 g  Vag-Spont   LIV      Birth Comments: GHTN/ Preeclampsia, hyperemesis  1 Gravida            Past Surgical History:  Procedure Laterality Date   NO PAST SURGERIES     TOOTH EXTRACTION Bilateral 03/10/2021   Procedure: DENTAL RESTORATION/EXTRACTIONS;  Surgeon: Sheryle Hamilton, DMD;  Location: [facility];  Service: Oral Surgery;  Laterality: Bilateral;   Family History  Problem Relation Age of Onset   Asthma Father    Asthma Maternal Aunt    Hypertension Maternal Uncle    Obesity Maternal Grandmother    Hypertension Maternal Grandmother    Asthma Paternal Grandmother    Allergic rhinitis Neg Hx    Angioedema Neg Hx    Immunodeficiency Neg Hx    Eczema Neg Hx    Urticaria Neg Hx    Social History[1] Allergies[2] Medications Prior to Admission  Medication Sig Dispense Refill Last Dose/Taking   ondansetron  (ZOFRAN -ODT) 4 MG disintegrating tablet Take 1 tablet (4 mg total) by mouth every 6 (six) hours as needed for nausea. 30 tablet 0 Past Week   [DISCONTINUED] SYMBICORT  160-4.5 MCG/ACT inhaler SMARTSIG:2 Puff(s) By Mouth Twice Daily   05/08/2024  NIFEdipine  (PROCARDIA -XL/NIFEDICAL-XL) 30 MG 24 hr tablet Take 1 tablet (30 mg total) by mouth daily. (Patient not taking: Reported on 03/14/2024) 30 tablet 0     I have reviewed patient's Past Medical Hx, Surgical Hx, Family Hx, Social Hx, medications and allergies.   ROS  Pertinent items noted in HPI and remainder of comprehensive ROS otherwise negative.   PHYSICAL EXAM  Patient Vitals for the past 24 hrs:  BP Temp Temp src Pulse Resp SpO2 Height Weight  05/09/24 1133 133/75 -- -- 78 -- -- -- --  05/09/24 1130 -- -- -- -- -- 100 % -- --  05/09/24 1115 -- -- -- -- -- 100 % -- --  05/09/24 1100 131/79 -- -- 76 -- -- -- --  05/09/24 1044 -- 98.3 F (36.8 C) Oral 73 -- 100 % -- --  05/09/24 1030 119/71 -- -- 71 -- 100 % -- --  05/09/24 1015 129/79 -- -- 78 -- 100 % -- --  05/09/24 1000 135/82 -- -- 76 -- 100 % -- --  05/09/24 0948 -- -- -- --  -- 98 % -- --  05/09/24 0946 121/84 -- -- 92 -- -- -- --  05/09/24 0945 -- -- -- -- -- 100 % -- --  05/09/24 0934 138/88 -- -- 89 -- -- -- --  05/09/24 0918 (!) 148/84 -- -- 86 -- 100 % -- --  05/09/24 0857 135/89 98.3 F (36.8 C) Oral 89 20 100 % 5' 2 (1.575 m) 104.4 kg    Constitutional: Well-developed, Obese female who appears to be laboring with breathing, shallow and notably wheezing  Cardiovascular: normal rate, Hypertensive warm and well-perfused Respiratory: Respirations are very shallow, labored, and audible wheezing heard on Auscultation, unable to take a deep breath w/o coughing GI: Abd soft, - limited exam secondary to maternal body habitus MS: Extremities nontender, no edema, normal ROM Neurologic: Alert and oriented x 4.     FHR: 154 via Doppler    Labs: Results for orders placed or performed during the hospital encounter of 05/09/24 (from the past 24 hours)  Resp panel by RT-PCR (RSV, Flu A&B, Covid) Anterior Nasal Swab     Status: None   Collection Time: 05/09/24  9:18 AM   Specimen: Anterior Nasal Swab  Result Value Ref Range   SARS Coronavirus 2 by RT PCR NEGATIVE NEGATIVE   Influenza A by PCR NEGATIVE NEGATIVE   Influenza B by PCR NEGATIVE NEGATIVE   Resp Syncytial Virus by PCR NEGATIVE NEGATIVE  CBC     Status: Abnormal   Collection Time: 05/09/24  9:18 AM  Result Value Ref Range   WBC 14.0 (H) 4.0 - 10.5 K/uL   RBC 4.57 3.87 - 5.11 MIL/uL   Hemoglobin 11.4 (L) 12.0 - 15.0 g/dL   HCT 64.9 (L) 63.9 - 53.9 %   MCV 76.6 (L) 80.0 - 100.0 fL   MCH 24.9 (L) 26.0 - 34.0 pg   MCHC 32.6 30.0 - 36.0 g/dL   RDW 84.0 (H) 88.4 - 84.4 %   Platelets 400 150 - 400 K/uL   nRBC 0.0 0.0 - 0.2 %  Comprehensive metabolic panel     Status: None   Collection Time: 05/09/24  9:18 AM  Result Value Ref Range   Sodium 136 135 - 145 mmol/L   Potassium 3.8 3.5 - 5.1 mmol/L   Chloride 103 98 - 111 mmol/L   CO2 22 22 - 32 mmol/L   Glucose, Bld 82 70 - 99  mg/dL   BUN 7 6 - 20  mg/dL   Creatinine, Ser 9.40 0.44 - 1.00 mg/dL   Calcium  9.4 8.9 - 10.3 mg/dL   Total Protein 7.2 6.5 - 8.1 g/dL   Albumin 4.1 3.5 - 5.0 g/dL   AST 21 15 - 41 U/L   ALT 31 0 - 44 U/L   Alkaline Phosphatase 56 38 - 126 U/L   Total Bilirubin 0.4 0.0 - 1.2 mg/dL   GFR, Estimated >39 >39 mL/min   Anion gap 11 5 - 15  Pro Brain natriuretic peptide     Status: None   Collection Time: 05/09/24  9:18 AM  Result Value Ref Range   Pro Brain Natriuretic Peptide 63.4 <300.0 pg/mL  Urinalysis, Routine w reflex microscopic -Urine, Clean Catch     Status: Abnormal   Collection Time: 05/09/24  9:34 AM  Result Value Ref Range   Color, Urine YELLOW YELLOW   APPearance HAZY (A) CLEAR   Specific Gravity, Urine 1.020 1.005 - 1.030   pH 6.0 5.0 - 8.0   Glucose, UA NEGATIVE NEGATIVE mg/dL   Hgb urine dipstick SMALL (A) NEGATIVE   Bilirubin Urine NEGATIVE NEGATIVE   Ketones, ur NEGATIVE NEGATIVE mg/dL   Protein, ur NEGATIVE NEGATIVE mg/dL   Nitrite NEGATIVE NEGATIVE   Leukocytes,Ua NEGATIVE NEGATIVE   RBC / HPF 0-5 0 - 5 RBC/hpf   WBC, UA 0-5 0 - 5 WBC/hpf   Bacteria, UA NONE SEEN NONE SEEN   Squamous Epithelial / HPF 0-5 0 - 5 /HPF   Mucus PRESENT   Protein / creatinine ratio, urine     Status: None   Collection Time: 05/09/24  9:34 AM  Result Value Ref Range   Creatinine, Urine 144 mg/dL   Total Protein, Urine 10 mg/dL   Protein Creatinine Ratio 0.1 <0.2 mg/mg    Imaging:  DG Chest Port 1 View Result Date: 05/09/2024 CLINICAL DATA:  Asthma attack.  Chest pain and hemoptysis. EXAM: PORTABLE CHEST 1 VIEW COMPARISON:  08/25/2019 FINDINGS: The heart size and mediastinal contours are within normal limits. Both lungs are clear. The visualized skeletal structures are unremarkable. IMPRESSION: No active disease. Electronically Signed   By: Toribio Agreste M.D.   On: 05/09/2024 09:33    MDM & MAU COURSE  MDM:  Acute asthma Exacerbation at [redacted]w[redacted]d with no OB C/O  Reports over past week ran out of her  Symbicort    CBC CMP PC Ratio BNP CXR: No acute pathology  EKG: NSR Respiratory Nebulizer Treatment ( Douoneb) Albuterol  inhaler ordered Solumedrol 125 mg IVP  Magnesium  Bolus 2 gm x 1 IVF at KVO  Predisone 40 mg PO x 1 Procardia  30 XR x 1 ( Patient was prescribed this on 11/5 for cHTN and has never started the medication) BP's elevated at 148/84  Baseline Hypertensive labs with PC Ratio ordered   Discussed case management with Dr Ozan and Dr Lola for acute asthma exacerbation( OB Attending's) for management assistance)   Will plan on lengthy discussion with patient prior to discharge on asthma management and medications for Rescue prn. Plan to refer to pulmonology and restart maintaince meds along with rescue inhaler prn   Patient reassessed at 11:12 AM after mag bolus, prednisone , IV fluids, Solu-Medrol  IV push, albuterol  inhaler and patient is observed in bed no longer wheezing, feeling much better with normal breath sounds, lungs now are bilaterally clear to auscultation.  Patient was counseled extensively on asthma management especially in pregnancy.  Plan is  to renew prescription for Symbicort  inhaler that she was previously on daily, albuterol  for rescue inhaler, and prednisone  40 mg p.o. daily x 5 days.  She will also be sent to referral to pulmonology and was encouraged to speak with her OB provider regarding her chronic hypertension and refusal to take Procardia  daily as prescribed.  Patient did agree to take her 1 dose here and will start baby aspirin  daily at home. This plan was previously discussed with Dr Lola [facility] Attending)  Will start to prepare for discharge as patient reports feeling much better and is now stable breathing without difficulty, VSS and labs are WNL with clear CXR. Patient encouraged to follow up in 1 week with primary OB Provider ( Will send message to Dr Henry MD- On Call private Attending)  Today's Vitals   05/09/24 1100 05/09/24 1115 05/09/24  1130 05/09/24 1133  BP: 131/79   133/75  Pulse: 76   78  Resp:      Temp:      TempSrc:      SpO2:  100% 100%   Weight:      Height:      PainSc:       Body mass index is 42.09 kg/m.     MAU Course: Orders Placed This Encounter  Procedures   Resp panel by RT-PCR (RSV, Flu A&B, Covid) Anterior Nasal Swab   DG Chest Port 1 View   Urinalysis, Routine w reflex microscopic -Urine, Clean Catch   CBC   Comprehensive metabolic panel   Pro Brain natriuretic peptide   Protein / creatinine ratio, urine   Ambulatory referral to Pulmonology   Airborne and Contact precautions   ED EKG   Insert peripheral IV   Discharge patient Discharge disposition: 01-Home or Self Care; Discharge patient date: 05/09/2024   Meds ordered this encounter  Medications   DISCONTD: albuterol  (PROVENTIL ) (2.5 MG/3ML) 0.083% nebulizer solution 2.5 mg   DISCONTD: albuterol  (VENTOLIN  HFA) 108 (90 Base) MCG/ACT inhaler 2 puff   ipratropium-albuterol  (DUONEB) 0.5-2.5 (3) MG/3ML nebulizer solution 3 mL   methylPREDNISolone  sodium succinate (SOLU-MEDROL ) 125 mg/2 mL injection 125 mg   predniSONE  (DELTASONE ) tablet 40 mg   magnesium  sulfate IVPB 2 g 50 mL   lactated ringers  bolus 1,000 mL   albuterol  (VENTOLIN  HFA) 108 (90 Base) MCG/ACT inhaler 2 puff   NIFEdipine  (PROCARDIA -XL/NIFEDICAL-XL) 24 hr tablet 30 mg   SYMBICORT  160-4.5 MCG/ACT inhaler    Sig: Inhale 2 puffs into the lungs daily.    Dispense:  1 each    Refill:  2    Supervising Provider:   PRATT, TANYA S [[id number]]   albuterol  (VENTOLIN  HFA) 108 (90 Base) MCG/ACT inhaler    Sig: Inhale 2 puffs into the lungs every 4 (four) hours as needed for wheezing or shortness of breath.    Dispense:  1 each    Refill:  1    Supervising Provider:   PRATT, TANYA S [[id number]]   montelukast  (SINGULAIR ) 10 MG tablet    Sig: Take 1 tablet (10 mg total) by mouth at bedtime.    Dispense:  30 tablet    Refill:  0    Supervising Provider:   PRATT, TANYA S [[id number]]   predniSONE   (DELTASONE ) 20 MG tablet    Sig: Take 2 tablets (40 mg total) by mouth daily.    Dispense:  5 tablet    Refill:  0    Supervising Provider:   PRATT, TANYA S [[id number]]  aspirin  EC 81 MG tablet    Sig: Take 1 tablet (81 mg total) by mouth daily. Take after 12 weeks for prevention of preeclampsia later in pregnancy    Dispense:  300 tablet    Refill:  2    Supervising Provider:   PRATT, TANYA S [[id number]]    ASSESSMENT   1. Severe asthma with acute exacerbation, unspecified whether persistent   2. [redacted] weeks gestation of pregnancy   3. Wheezing on inspiration   4. Chronic hypertension affecting pregnancy     PLAN   Severe asthma with acute exacerbation, unspecified whether persistent Patient was given nebulizer treatment here in the hospital by Respiratory Team ( Consult Team) DuoNeb was prescribed Solu-Medrol  125 mg IV push Magnesium  2 mg IV piggyback Chest x-ray: No acute findings Labs were drawn no acute findings Influenza flu swabs( Flu , COVID, RSV Negative) Prednisone  40 mg p.o. x 1 IV fluids Patient reassessed numerous times during her visit here in the MAU and consults with OB attendings Dr.Ozan and Dr Lola were obtained while patient was being managed here in MAU by me Pulmonology referral at Discharge placed Patient to follow-up with OB provider in 1 week and message sent to her primary OB doctor at [facility]. Dr Henry ( On Call)  [redacted] weeks gestation of pregnancy Fetal heart rate via Doppler normal range No OB complaints  Wheezing on inspiration Resolved after medications given above  Chronic hypertension affecting pregnancy Patient has a diagnosis of chronic hypertension Procardia  30 XL prescribed 11/5 (patient not taking her medication although we did have a lengthy conversation about her diagnosis and need to take this medication) patient states she will discuss further with her OB ASA 81 mg daily prescribed Baseline labs NM today One MRBP ( Procardia  30  XL x 1 dose given here in MAU    Discharge home in stable condition with return precautions.   See AVS for full description of information given to the patient including both verbal and written. Patient verbalized understanding and agrees with the plan as described above.     Follow-up Information     [facility] Follow up in 1 week(s).   Specialty: Obstetrics and Gynecology Why: Follow up Contact information: [address] [place] [address] [phone]                 Allergies as of 05/09/2024       Reactions   Other Anaphylaxis   Shellfish Allergy Anaphylaxis   Apple Itching   Augmentin [amoxicillin-pot Clavulanate] Hives   Has patient had a PCN reaction causing immediate rash, facial/tongue/throat swelling, SOB or lightheadedness with hypotension: Unknown Has patient had a PCN reaction causing severe rash involving mucus membranes or skin necrosis: Unknown Has patient had a PCN reaction that required hospitalization: Unknown Has patient had a PCN reaction occurring within the last 10 years: Yes If all of the above answers are NO, then may proceed with Cephalosporin use.   Banana Itching   Ceftin [cefuroxime Axetil] Hives   Unknown reaction   Cherry    Justicia Adhatoda (malabar Nut Tree) [justicia Adhatoda]    All nuts grown on tree   Peach [prunus Persica] Itching   Plum Pulp Itching   Watermelon [citrullus Vulgaris] Itching        Medication List     TAKE these medications    albuterol  108 (90 Base) MCG/ACT inhaler Commonly known as: VENTOLIN  HFA Inhale 2 puffs into the lungs  every 4 (four) hours as needed for wheezing or shortness of breath.   aspirin  EC 81 MG tablet Take 1 tablet (81 mg total) by mouth daily. Take after 12 weeks for prevention of preeclampsia later in pregnancy   montelukast  10 MG tablet Commonly known as: Singulair  Take 1 tablet (10 mg total) by mouth at bedtime.   NIFEdipine  30 MG 24 hr  tablet Commonly known as: PROCARDIA -XL/NIFEDICAL-XL Take 1 tablet (30 mg total) by mouth daily.   ondansetron  4 MG disintegrating tablet Commonly known as: ZOFRAN -ODT Take 1 tablet (4 mg total) by mouth every 6 (six) hours as needed for nausea.   predniSONE  20 MG tablet Commonly known as: DELTASONE  Take 2 tablets (40 mg total) by mouth daily.   Symbicort  160-4.5 MCG/ACT inhaler Generic drug: budesonide -formoterol  Inhale 2 puffs into the lungs daily. What changed: See the new instructions.        Olam Dalton, MSN, WHNP-BC [facility] [employer]       [1]  Social History Tobacco Use   Smoking status: Never    Passive exposure: Never   Smokeless tobacco: Never  Vaping Use   Vaping status: Never Used  Substance Use Topics   Alcohol use: No   Drug use: Not Currently    Types: Marijuana    Comment: last smoked prior to pregnancy  [2]  Allergies Allergen Reactions   Other Anaphylaxis   Shellfish Allergy Anaphylaxis   Apple Itching   Augmentin [Amoxicillin-Pot Clavulanate] Hives    Has patient had a PCN reaction causing immediate rash, facial/tongue/throat swelling, SOB or lightheadedness with hypotension: Unknown Has patient had a PCN reaction causing severe rash involving mucus membranes or skin necrosis: Unknown Has patient had a PCN reaction that required hospitalization: Unknown Has patient had a PCN reaction occurring within the last 10 years: Yes If all of the above answers are NO, then may proceed with Cephalosporin use.    Banana Itching   Ceftin [Cefuroxime Axetil] Hives    Unknown reaction   Cherry    Justicia Adhatoda (Malabar Nut Tree) [Justicia Adhatoda]     All nuts grown on tree   Peach [Prunus Persica] Itching   Plum Pulp Itching   Watermelon [Citrullus Vulgaris] Itching

## 2024-05-09 NOTE — Discharge Instructions (Addendum)
 You were treated today in the MAU for an acute asthma attack.  I have prescribed the following medications for you Symbicort  renewed as you were previously taking it.  I have also prescribed an albuterol  inhaler as a rescue for an acute asthma attack, Singulair  10 mg please take daily at night, prednisone  40 mg daily x 5 days.  I have also sent a referral for pulmonology consult to manage your asthma during your pregnancy.  It is also advised that you follow-up with your primary OB in 1 week  I have also discussed with you the diagnosis of chronic hypertension in pregnancy and advised that you start taking your Procardia  30 XL as prescribed.  Please have this discussion with your primary OB I have sent a prescription in for baby aspirin  that I advise you start taking daily as well.  Thank you it was a pleasure taking care of you today

## 2024-05-09 NOTE — MAU Note (Signed)
 Tracy Crawford is a 24 y.o. at [redacted]w[redacted]d here in MAU reporting: back to back asthma attacks started last week and was using inhaler more often then usual. Inhaler is now out. Chest pain and coughing up blood. Denies VB  LMP: - Onset of complaint:  1 week ago but worse since this morning. Pain score: 6/10 Vitals:   05/09/24 0857  BP: 135/89  Pulse: 89  Resp: 20  Temp: 98.3 F (36.8 C)  SpO2: 100%     FHT: to due in room due to pt stating placenta in the front  Lab orders placed from triage: EKG, us 

## 2024-05-11 ENCOUNTER — Encounter (HOSPITAL_COMMUNITY): Payer: MEDICAID | Admitting: Psychiatry

## 2024-05-11 ENCOUNTER — Encounter (HOSPITAL_COMMUNITY): Payer: Self-pay
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
## 2024-05-25 ENCOUNTER — Encounter (HOSPITAL_COMMUNITY): Payer: MEDICAID | Admitting: Psychiatry
